# Patient Record
Sex: Female | Born: 1965 | Race: White | Hispanic: No | Marital: Single | Smoking: Current every day smoker
Health system: Southern US, Community
[De-identification: ages and names within clinical notes are randomized; demographics above are authoritative.]

## PROBLEM LIST (undated history)

## (undated) DIAGNOSIS — F329 Major depressive disorder, single episode, unspecified: Secondary | ICD-10-CM

## (undated) DIAGNOSIS — F419 Anxiety disorder, unspecified: Secondary | ICD-10-CM

## (undated) DIAGNOSIS — K219 Gastro-esophageal reflux disease without esophagitis: Secondary | ICD-10-CM

## (undated) DIAGNOSIS — I10 Essential (primary) hypertension: Secondary | ICD-10-CM

## (undated) DIAGNOSIS — F32A Depression, unspecified: Secondary | ICD-10-CM

## (undated) HISTORY — DX: Anxiety disorder, unspecified: F41.9

## (undated) HISTORY — DX: Depression, unspecified: F32.A

## (undated) HISTORY — DX: Major depressive disorder, single episode, unspecified: F32.9

## (undated) HISTORY — PX: ABDOMINAL SURGERY: SHX537

## (undated) HISTORY — PX: FOOT SURGERY: SHX648

---

## 2009-03-24 ENCOUNTER — Ambulatory Visit: Payer: Self-pay | Admitting: Internal Medicine

## 2011-05-01 ENCOUNTER — Inpatient Hospital Stay: Payer: Self-pay | Admitting: Surgery

## 2011-05-01 ENCOUNTER — Ambulatory Visit: Payer: Self-pay | Admitting: Family Medicine

## 2011-06-04 ENCOUNTER — Ambulatory Visit: Payer: Self-pay | Admitting: Surgery

## 2011-06-05 ENCOUNTER — Ambulatory Visit: Payer: Self-pay | Admitting: Surgery

## 2011-07-13 ENCOUNTER — Ambulatory Visit: Payer: Self-pay | Admitting: Surgery

## 2011-07-20 ENCOUNTER — Inpatient Hospital Stay: Payer: Self-pay | Admitting: Surgery

## 2013-10-16 ENCOUNTER — Ambulatory Visit: Payer: Self-pay | Admitting: Nurse Practitioner

## 2013-12-05 ENCOUNTER — Ambulatory Visit: Payer: Self-pay | Admitting: Emergency Medicine

## 2013-12-24 ENCOUNTER — Ambulatory Visit: Payer: Self-pay

## 2013-12-24 LAB — COMPREHENSIVE METABOLIC PANEL
ALK PHOS: 76 U/L
ALT: 35 U/L (ref 12–78)
Albumin: 3.8 g/dL (ref 3.4–5.0)
Anion Gap: 12 (ref 7–16)
BILIRUBIN TOTAL: 0.4 mg/dL (ref 0.2–1.0)
BUN: 10 mg/dL (ref 7–18)
CALCIUM: 8.3 mg/dL — AB (ref 8.5–10.1)
CHLORIDE: 104 mmol/L (ref 98–107)
Co2: 23 mmol/L (ref 21–32)
Creatinine: 0.82 mg/dL (ref 0.60–1.30)
EGFR (African American): >60
EGFR (Non-African Amer.): >60
Glucose: 108 mg/dL — ABNORMAL HIGH (ref 65–99)
Osmolality: 277 (ref 275–301)
POTASSIUM: 3.8 mmol/L (ref 3.5–5.1)
SGOT(AST): 14 U/L — ABNORMAL LOW (ref 15–37)
Sodium: 139 mmol/L (ref 136–145)
Total Protein: 7.4 g/dL (ref 6.4–8.2)

## 2013-12-24 LAB — CBC WITH DIFFERENTIAL/PLATELET
Basophil #: 0.1 10*3/uL (ref 0.0–0.1)
Basophil %: 0.6 %
Eosinophil #: 0.3 10*3/uL (ref 0.0–0.7)
Eosinophil %: 2.7 %
HCT: 37.6 % (ref 35.0–47.0)
HGB: 12.7 g/dL (ref 12.0–16.0)
Lymphocyte #: 2.7 10*3/uL (ref 1.0–3.6)
Lymphocyte %: 25.6 %
MCH: 31.1 pg (ref 26.0–34.0)
MCHC: 33.8 g/dL (ref 32.0–36.0)
MCV: 92 fL (ref 80–100)
MONOS PCT: 6.6 %
Monocyte #: 0.7 x10 3/mm (ref 0.2–0.9)
NEUTROS ABS: 6.9 10*3/uL — AB (ref 1.4–6.5)
Neutrophil %: 64.5 %
PLATELETS: 255 10*3/uL (ref 150–440)
RBC: 4.09 10*6/uL (ref 3.80–5.20)
RDW: 13 % (ref 11.5–14.5)
WBC: 10.7 10*3/uL (ref 3.6–11.0)

## 2013-12-24 LAB — URIC ACID: Uric Acid: 5 mg/dL (ref 2.6–6.0)

## 2014-10-13 IMAGING — CR DG TOE 3RD*R*
1 series · 3 of 3 positions shown · non-contrast
Comparison: None.

CLINICAL DATA: Swelling of the toe

EXAM:
RIGHT THIRD TOE

[Series 1: ap · 0.17mm/px · 3 of 3 slices shown]
[im 1/3]
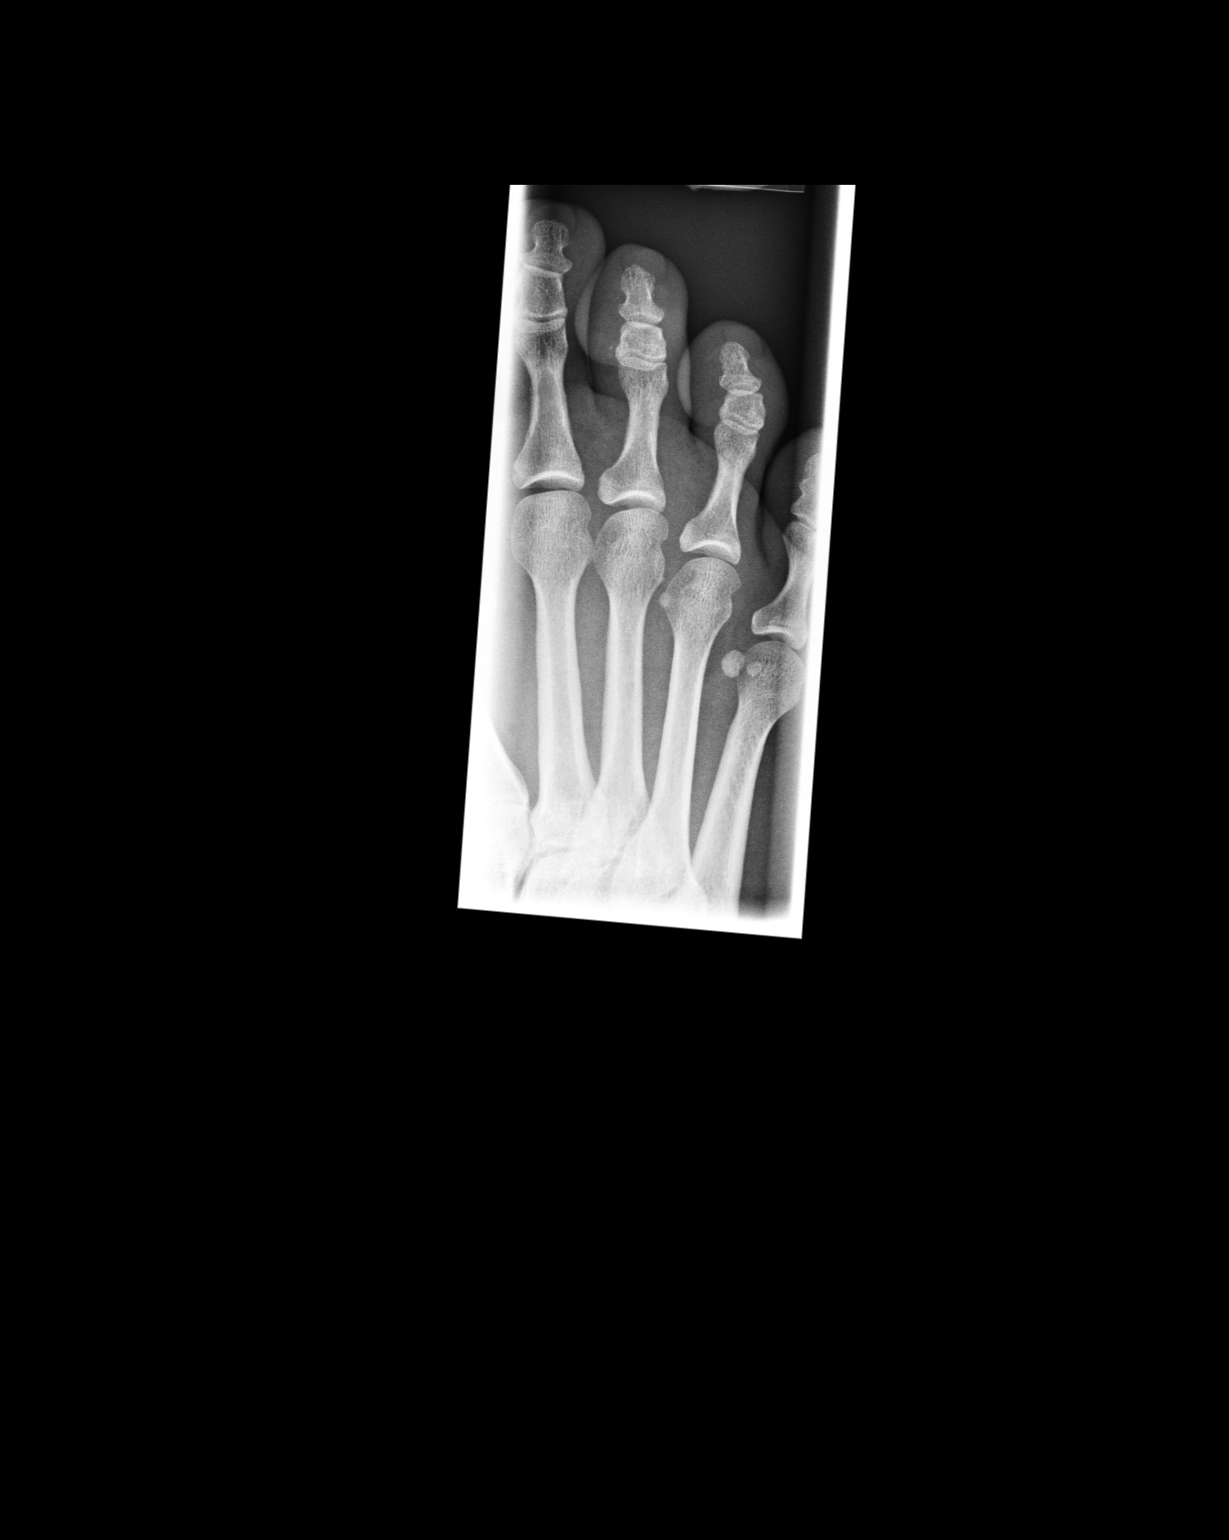
[im 2/3]
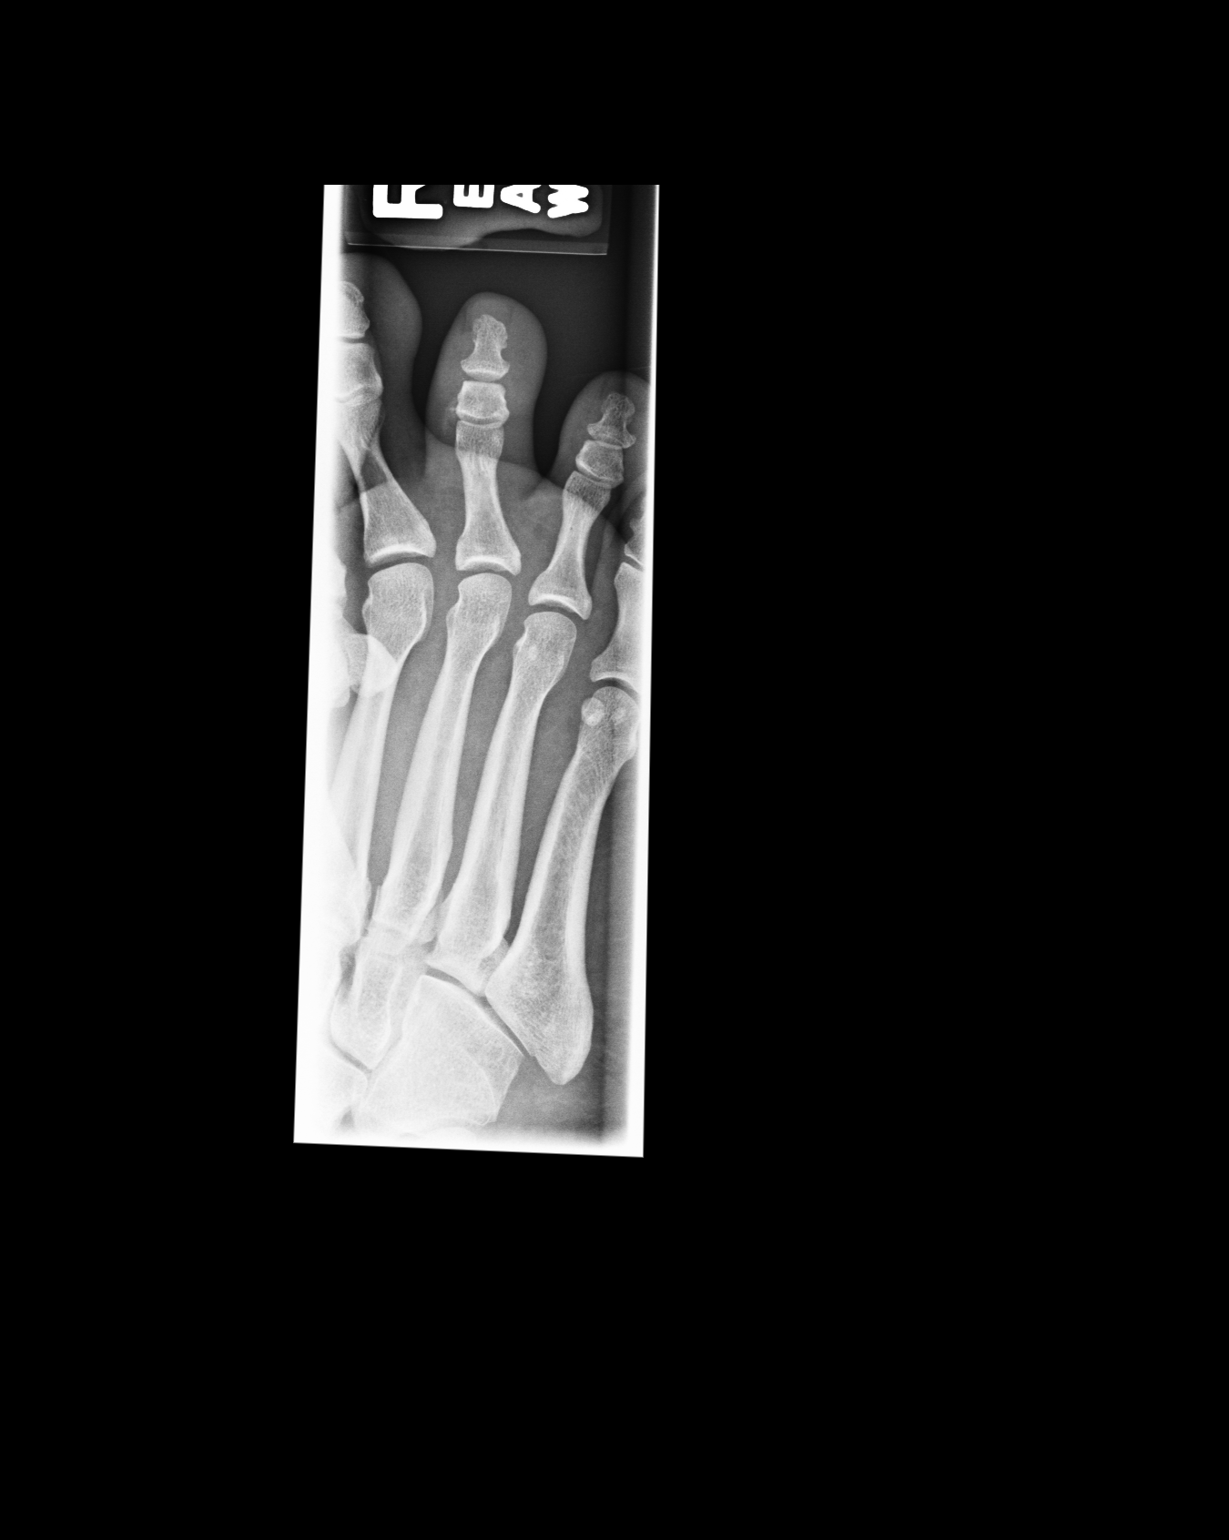
[im 3/3]
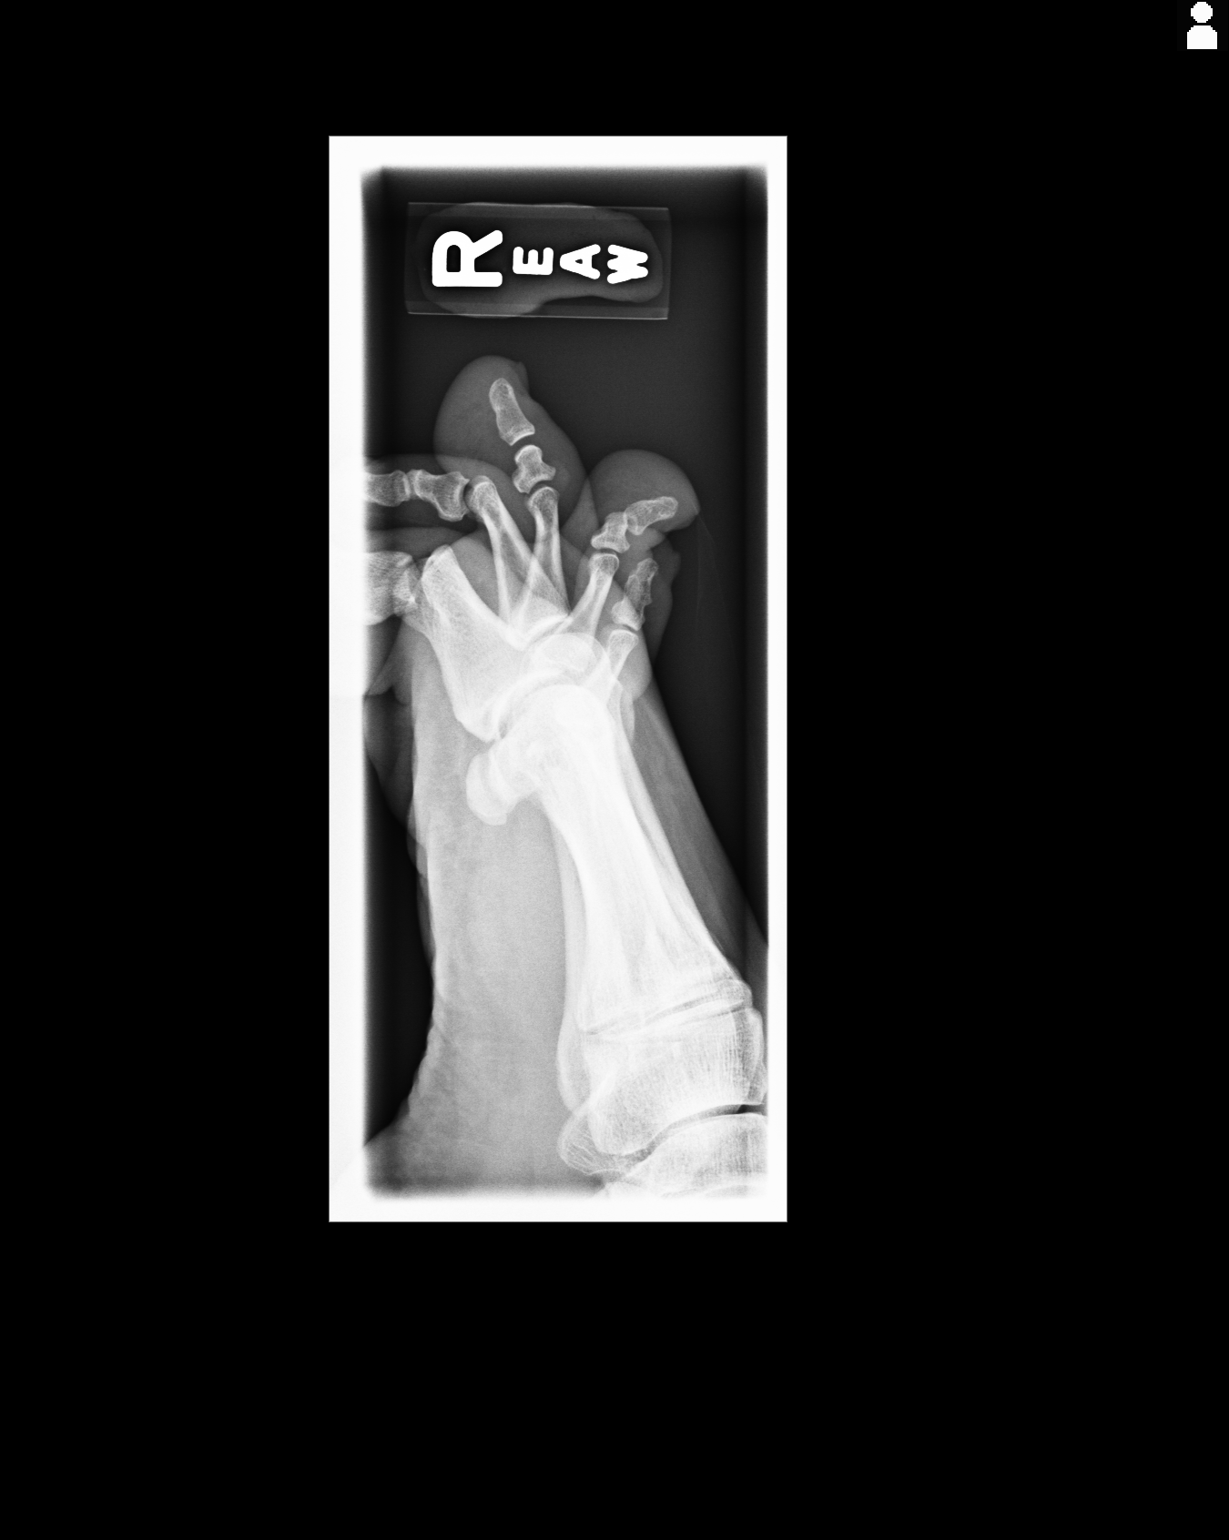

[3 of 3 positions shown; findings below may reference images not displayed]

FINDINGS: No acute fracture or dislocation is noted. On the medial aspect of
the third middle phalanx and slightly dorsally there is a a
radiopaque density which may represent a foreign body. Clinical
correlation with any recent injury is recommended. No other focal
abnormality is seen.

## 2014-12-13 ENCOUNTER — Ambulatory Visit
Admit: 2014-12-13 | Disposition: A | Discharge status: Home / Self Care | Payer: Self-pay | Attending: Family Medicine | Admitting: Family Medicine

## 2015-01-03 ENCOUNTER — Encounter: Payer: Self-pay | Admitting: Podiatry

## 2015-01-03 ENCOUNTER — Ambulatory Visit (INDEPENDENT_AMBULATORY_CARE_PROVIDER_SITE_OTHER): Payer: No Typology Code available for payment source | Admitting: Podiatry

## 2015-01-03 ENCOUNTER — Ambulatory Visit (INDEPENDENT_AMBULATORY_CARE_PROVIDER_SITE_OTHER): Payer: No Typology Code available for payment source

## 2015-01-03 VITALS — BP 153/98 | HR 68 | Resp 16 | Ht 67.5 in | Wt 180.0 lb

## 2015-01-03 DIAGNOSIS — M779 Enthesopathy, unspecified: Secondary | ICD-10-CM

## 2015-01-03 DIAGNOSIS — M7751 Other enthesopathy of right foot: Secondary | ICD-10-CM

## 2015-01-03 DIAGNOSIS — M2010 Hallux valgus (acquired), unspecified foot: Secondary | ICD-10-CM

## 2015-01-03 DIAGNOSIS — M205X2 Other deformities of toe(s) (acquired), left foot: Secondary | ICD-10-CM

## 2015-01-03 DIAGNOSIS — M109 Gout, unspecified: Secondary | ICD-10-CM | POA: Insufficient documentation

## 2015-01-03 DIAGNOSIS — IMO0002 Reserved for concepts with insufficient information to code with codable children: Secondary | ICD-10-CM | POA: Insufficient documentation

## 2015-01-03 DIAGNOSIS — K5792 Diverticulitis of intestine, part unspecified, without perforation or abscess without bleeding: Secondary | ICD-10-CM | POA: Insufficient documentation

## 2015-01-03 DIAGNOSIS — E559 Vitamin D deficiency, unspecified: Secondary | ICD-10-CM | POA: Insufficient documentation

## 2015-01-03 DIAGNOSIS — M778 Other enthesopathies, not elsewhere classified: Secondary | ICD-10-CM

## 2015-01-03 DIAGNOSIS — F419 Anxiety disorder, unspecified: Secondary | ICD-10-CM | POA: Insufficient documentation

## 2015-01-03 DIAGNOSIS — E785 Hyperlipidemia, unspecified: Secondary | ICD-10-CM | POA: Insufficient documentation

## 2015-01-03 DIAGNOSIS — F329 Major depressive disorder, single episode, unspecified: Secondary | ICD-10-CM | POA: Insufficient documentation

## 2015-01-03 DIAGNOSIS — F32A Depression, unspecified: Secondary | ICD-10-CM | POA: Insufficient documentation

## 2015-01-03 NOTE — Patient Instructions (Signed)
Pre-Operative Instructions  Congratulations, you have decided to take an important step to improving your quality of life.  You can be assured that the doctors of [facility] will be with you every step of the way.  1. Plan to be at the surgery center/hospital at least 1 (one) hour prior to your scheduled time unless otherwise directed by the surgical center/hospital staff.  You must have a responsible adult accompany you, remain during the surgery and drive you home.  Make sure you have directions to the surgical center/hospital and know how to get there on time. 2. For hospital based surgery you will need to obtain a history and physical form from your family physician within 1 month prior to the date of surgery- we will give you a form for you primary physician.  3. We make every effort to accommodate the date you request for surgery.  There are however, times where surgery dates or times have to be moved.  We will contact you as soon as possible if a change in schedule is required.   4. No Aspirin/Ibuprofen for one week before surgery.  If you are on aspirin, any non-steroidal anti-inflammatory medications (Mobic, Aleve, Ibuprofen) you should stop taking it 7 days prior to your surgery.  You make take Tylenol  For pain prior to surgery.  5. Medications- If you are taking daily heart and blood pressure medications, seizure, reflux, allergy, asthma, anxiety, pain or diabetes medications, make sure the surgery center/hospital is aware before the day of surgery so they may notify you which medications to take or avoid the day of surgery. 6. No food or drink after midnight the night before surgery unless directed otherwise by surgical center/hospital staff. 7. No alcoholic beverages 24 hours prior to surgery.  No smoking 24 hours prior to or 24 hours after surgery. 8. Wear loose pants or shorts- loose enough to fit over bandages, boots, and casts. 9. No slip on shoes, sneakers are best. 10. Bring  your boot with you to the surgery center/hospital.  Also bring crutches or a walker if your physician has prescribed it for you.  If you do not have this equipment, it will be provided for you after surgery. 11. If you have not been contracted by the surgery center/hospital by the day before your surgery, call to confirm the date and time of your surgery. 12. Leave-time from work may vary depending on the type of surgery you have.  Appropriate arrangements should be made prior to surgery with your employer. 13. Prescriptions will be provided immediately following surgery by your doctor.  Have these filled as soon as possible after surgery and take the medication as directed. 14. Remove nail polish on the operative foot. 15. Wash the night before surgery.  The night before surgery wash the foot and leg well with the antibacterial soap provided and water paying special attention to beneath the toenails and in between the toes.  Rinse thoroughly with water and dry well with a towel.  Perform this wash unless told not to do so by your physician.  Enclosed: 1 Ice pack (please put in freezer the night before surgery)   1 Hibiclens skin cleaner   Pre-op Instructions  If you have any questions regarding the instructions, do not hesitate to call our office.  [place]: [address] [place], [place] [address] [phone]  [place]: [address] [place], [place] [address] [phone]  [place]: [address]  ,  [address] [phone]  Dr. Richard   Tuchman DPM, Dr. Norman Regal DPM Dr. Richard Sikora DPM, Dr. M. Todd Hyatt DPM, Dr. Kathryn Egerton DPM 

## 2015-01-03 NOTE — Progress Notes (Signed)
   Subjective:    Patient ID: Theresa Sanders, female    DOB: 24-Mar-1966, 49 y.o.   MRN: [id number]  HPI bilateral big toes are painful, the left foot the outside of my foot hurts. My feet have been hurting for over a year.  Bunions bilateral     Review of Systems  All other systems reviewed and are negative.      Objective:   Physical Exam: I have reviewed her past medical history medications allergies surgery social history and review of systems. Pulses are strongly palpable bilateral. Neurologic sensorium is intact per Semmes-Weinstein monofilament. Deep tendon reflexes are intact bilateral muscle strength +5 over 5 dorsiflexion plantar flexors and inverters everters all intrinsic musculature is intact. Orthopedic evaluation demonstrates all joints distal to the ankle for range of motion with exception of the first metatarsophalangeal joints bilaterally. These joints are so tender that she can hardly allow me to touch them. Both of the first metatarsophalangeal joints are swollen and painful on range of motion. There is large bony prominences limiting the range of motion. She states that she can hardly walk to perform her daily activities much lesser job at this point. Radiographic evaluation does demonstrate severe bony overgrowth first metatarsophalangeal joints bilateral left greater than right dorsal spurring. Subchondral sclerosis subchondral eburnation and cystic deformity is noted. These are severe hallux rigidus joints. Cutaneous evaluation demonstrates supple well-hydrated cutis no erythema edema saline as drainage or odor with exception of of the overlying joints.        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  Plan: We discussed the etiology pathology conservative versus surgical therapies and detail today. I was unable to  distract the joints for an intra-articular injection. At this point her only opportunity for relief will be a surgical intervention consisting of either a fusion of the first metatarsophalangeal joint or a arthroplasty of the first metatarsophalangeal joint. At this point we have chosen an arthroplasty, Keller arthroplasty with a single silicone implant left first metatarsophalangeal joint. We will try to distract the right great toe in the operating room to buy her some time within cortisone injection. We went over the consent form today line by line number by number giving her adequate time to ask questions she saw fit regarding both of these procedures I answered them to the best of my ability in layman's terms. She understood this was amenable to it and signed all 3 pages of the consent form. She was dispensed a cam walker for her postop. And I will follow-up with her in the near future for surgical intervention.

## 2015-01-19 ENCOUNTER — Other Ambulatory Visit: Payer: Self-pay | Admitting: Podiatry

## 2015-01-19 MED ORDER — CEPHALEXIN 500 MG PO CAPS: 500.0000 mg | ORAL_CAPSULE | Freq: Three times a day (TID) | ORAL | Status: DC

## 2015-01-19 MED ORDER — PROMETHAZINE HCL 25 MG PO TABS: 25.0000 mg | ORAL_TABLET | Freq: Three times a day (TID) | ORAL | Status: DC | PRN

## 2015-01-19 MED ORDER — OXYCODONE-ACETAMINOPHEN 10-325 MG PO TABS: ORAL_TABLET | ORAL | Status: DC

## 2015-01-21 ENCOUNTER — Encounter: Payer: Self-pay | Admitting: Podiatry

## 2015-01-21 DIAGNOSIS — M2012 Hallux valgus (acquired), left foot: Secondary | ICD-10-CM | POA: Diagnosis not present

## 2015-01-26 ENCOUNTER — Encounter: Payer: Self-pay | Admitting: Podiatry

## 2015-01-26 ENCOUNTER — Ambulatory Visit (INDEPENDENT_AMBULATORY_CARE_PROVIDER_SITE_OTHER): Payer: No Typology Code available for payment source | Admitting: Podiatry

## 2015-01-26 ENCOUNTER — Ambulatory Visit (INDEPENDENT_AMBULATORY_CARE_PROVIDER_SITE_OTHER): Payer: No Typology Code available for payment source

## 2015-01-26 VITALS — BP 171/91 | HR 69 | Resp 16

## 2015-01-26 DIAGNOSIS — M205X2 Other deformities of toe(s) (acquired), left foot: Secondary | ICD-10-CM

## 2015-01-26 DIAGNOSIS — Z9889 Other specified postprocedural states: Secondary | ICD-10-CM

## 2015-01-26 MED ORDER — OXYCODONE-ACETAMINOPHEN 10-325 MG PO TABS: ORAL_TABLET | ORAL | Status: DC

## 2015-01-26 NOTE — Progress Notes (Signed)
She presents today 1 week status post Keller arthroplasty with single silicone implant left foot. Date of surgery Friday, 01/21/2015. She states that she's doing quite well. Continues to take pain medication as needed. Denies fever chills nausea vomiting muscle aches and pains other than soreness in the foot. She states she's been trying to move the toes a little bit.  Objective: Vital signs are stable alert and oriented 3. Pulses are strongly palpable. Dry sterile dressing was intact once removed demonstrates minimal edema no erythema cellulitis drainage or odor mild dehiscence of the distalmost aspect of the wound but no signs of infection. Radiographic evaluation demonstrates a well-placed Keller arthroplasty with a single silicone implant into grommets. No signs of bone infection. No fractures are identified.  Assessment: Well-healing surgical foot.  Plan: Discussed etiology pathology conservative versus surgical therapies. Encouraged range of motion exercises. Place Steri-Strips to the incision line today. Placed Silvadene cream Telfa pad dry sterile compressive dressing and placed her back in her cam walker. She understands she is to keep this elevated she can only take the boot off to perform her exercises. I will follow up with her in 1 week at which time we have to be able to remove the sutures. She will call with questions or concerns and I did refill her pain medication.

## 2015-01-27 NOTE — Progress Notes (Signed)
DOS 01/21/2015 Keller arthroplasty with single silicone implant left foot, injection right 1st MTPJ with steroid.

## 2015-02-02 ENCOUNTER — Encounter: Payer: Self-pay | Admitting: Podiatry

## 2015-02-02 ENCOUNTER — Ambulatory Visit (INDEPENDENT_AMBULATORY_CARE_PROVIDER_SITE_OTHER): Payer: No Typology Code available for payment source | Admitting: Podiatry

## 2015-02-02 VITALS — BP 156/85 | HR 69 | Temp 97.9°F | Resp 16

## 2015-02-02 DIAGNOSIS — Z9889 Other specified postprocedural states: Secondary | ICD-10-CM

## 2015-02-02 DIAGNOSIS — M205X2 Other deformities of toe(s) (acquired), left foot: Secondary | ICD-10-CM | POA: Diagnosis not present

## 2015-02-02 NOTE — Progress Notes (Signed)
She presents today 2 weeks status post Keller arthroplasty with single silicone implant left foot. She states it is doing so much better she is very happy with the outcome thus far.  Objective: Vital signs are stable she's alert and oriented 3 margins appear to be well coapted sutures are in place she has great range of motion dorsiflexion and plantarflexion.  Assessment: Well-healed surgical Keller arthroplasty with single silicone implant.  Plan: Remove stitches today Ace wrap compression anklet and will allow her to start walking with a Darco shoe. She will start washing this foot regularly and I will follow-up with her in 2 weeks.

## 2015-02-03 ENCOUNTER — Telehealth: Payer: Self-pay | Admitting: *Deleted

## 2015-02-03 MED ORDER — OXYCODONE-ACETAMINOPHEN 10-325 MG PO TABS: ORAL_TABLET | ORAL | Status: DC

## 2015-02-03 NOTE — Telephone Encounter (Signed)
Ok for refill? 

## 2015-02-03 NOTE — Telephone Encounter (Signed)
Pt request more pain medication, foot is painful due to suture removal, and would like enough for 1 week.

## 2015-02-04 NOTE — Telephone Encounter (Signed)
Patient was called and notified to pick up prescription in [place]

## 2015-02-14 ENCOUNTER — Telehealth: Payer: Self-pay | Admitting: *Deleted

## 2015-02-14 NOTE — Telephone Encounter (Signed)
Pt states she would like to get a refill of her pain medication, and she will pick it up at her Wednesday appt.

## 2015-02-16 ENCOUNTER — Ambulatory Visit (INDEPENDENT_AMBULATORY_CARE_PROVIDER_SITE_OTHER): Payer: No Typology Code available for payment source

## 2015-02-16 ENCOUNTER — Ambulatory Visit (INDEPENDENT_AMBULATORY_CARE_PROVIDER_SITE_OTHER): Payer: No Typology Code available for payment source | Admitting: Podiatry

## 2015-02-16 VITALS — BP 167/96 | HR 88 | Resp 16

## 2015-02-16 DIAGNOSIS — M205X2 Other deformities of toe(s) (acquired), left foot: Secondary | ICD-10-CM

## 2015-02-16 DIAGNOSIS — Z9889 Other specified postprocedural states: Secondary | ICD-10-CM

## 2015-02-16 MED ORDER — OXYCODONE-ACETAMINOPHEN 10-325 MG PO TABS: ORAL_TABLET | ORAL | Status: DC

## 2015-02-16 NOTE — Telephone Encounter (Signed)
Patient presents for appt today and pain meds were refilled and hard copy given to pt.

## 2015-02-17 NOTE — Progress Notes (Signed)
She presents today a little more than 1 month status post Keller arthroplasty single silicone implant left foot. He states it is been real sore.  Objective: Vital signs are stable she is alert and oriented 3. No erythematous mild edema no cellulitis drainage or odor. She has good range of motion of the joint.  Assessment: Well-healing surgical foot.  Plan: I encouraged her to try to get back into a regular pair shoes continue anti-inflammatory's as needed and follow up with me in 1 month.

## 2015-02-28 ENCOUNTER — Other Ambulatory Visit: Payer: Self-pay | Admitting: *Deleted

## 2015-02-28 MED ORDER — OXYCODONE-ACETAMINOPHEN 10-325 MG PO TABS: ORAL_TABLET | ORAL | Status: DC

## 2015-02-28 NOTE — Telephone Encounter (Signed)
Ok to fill per dr Milinda Pointer

## 2015-02-28 NOTE — Telephone Encounter (Signed)
Would like a refill on my pain medication , would like to pick it up before lunch today

## 2015-03-08 ENCOUNTER — Other Ambulatory Visit: Payer: Self-pay | Admitting: *Deleted

## 2015-03-08 NOTE — Telephone Encounter (Signed)
Pt states she is going on vacation next week and would like a refill of her pain medication.

## 2015-03-09 MED ORDER — OXYCODONE-ACETAMINOPHEN 10-325 MG PO TABS: ORAL_TABLET | ORAL | Status: DC

## 2015-03-09 NOTE — Telephone Encounter (Signed)
OK TO FILL PER DR HYATT , THIS IS THE LAST REFILL

## 2015-03-23 ENCOUNTER — Encounter: Payer: No Typology Code available for payment source | Admitting: Podiatry

## 2015-03-28 ENCOUNTER — Ambulatory Visit (INDEPENDENT_AMBULATORY_CARE_PROVIDER_SITE_OTHER): Payer: No Typology Code available for payment source | Admitting: Podiatry

## 2015-03-28 ENCOUNTER — Ambulatory Visit (INDEPENDENT_AMBULATORY_CARE_PROVIDER_SITE_OTHER): Payer: No Typology Code available for payment source

## 2015-03-28 VITALS — BP 160/95 | HR 79 | Resp 16

## 2015-03-28 DIAGNOSIS — Z9889 Other specified postprocedural states: Secondary | ICD-10-CM

## 2015-03-28 DIAGNOSIS — M205X2 Other deformities of toe(s) (acquired), left foot: Secondary | ICD-10-CM

## 2015-03-28 NOTE — Progress Notes (Signed)
She presents today for follow-up of her Jake Michaelis arthroplasty first metatarsophalangeal joint left foot with single silicone implant. Date of surgery was 01/21/2015. She states that is doing real good.  Objective: Vital signs are stable she is alert and oriented 3. She has no pain on range of motion of the first metatarsophalangeal joint left foot. Pulses remain palpable. She has great range of motion dorsiflexion and plantarflexion left foot. Radiographs confirm a Keller arthroplasty with single silicone implant with 2 grommets.  Assessment: Well-healing surgical foot date of surgery 01/21/2015.  Plan: Follow up with me on an as-needed basis.

## 2015-04-25 ENCOUNTER — Encounter: Payer: No Typology Code available for payment source | Admitting: Podiatry

## 2015-10-12 ENCOUNTER — Ambulatory Visit
Admission: EM | Admit: 2015-10-12 | Discharge: 2015-10-12 | Disposition: A | Discharge status: Home / Self Care | Payer: PRIVATE HEALTH INSURANCE | Attending: Family Medicine | Admitting: Family Medicine

## 2015-10-12 ENCOUNTER — Encounter: Payer: Self-pay | Admitting: Emergency Medicine

## 2015-10-12 DIAGNOSIS — M62830 Muscle spasm of back: Secondary | ICD-10-CM | POA: Diagnosis not present

## 2015-10-12 DIAGNOSIS — M545 Low back pain, unspecified: Secondary | ICD-10-CM

## 2015-10-12 HISTORY — DX: Gastro-esophageal reflux disease without esophagitis: K21.9

## 2015-10-12 MED ORDER — KETOROLAC TROMETHAMINE 60 MG/2ML IM SOLN
60.0000 mg | Freq: Once | INTRAMUSCULAR | Status: AC
  Administered 2015-10-12: 60 mg via INTRAMUSCULAR

## 2015-10-12 MED ORDER — MELOXICAM 15 MG PO TABS: 15.0000 mg | ORAL_TABLET | Freq: Every day | ORAL | Status: DC

## 2015-10-12 MED ORDER — HYDROCODONE-ACETAMINOPHEN 5-325 MG PO TABS: 1.0000 | ORAL_TABLET | Freq: Three times a day (TID) | ORAL | Status: DC | PRN

## 2015-10-12 MED ORDER — ORPHENADRINE CITRATE ER 100 MG PO TB12: 100.0000 mg | ORAL_TABLET | Freq: Two times a day (BID) | ORAL | Status: DC

## 2015-10-12 NOTE — ED Provider Notes (Addendum)
CSN: [id number]     Arrival date & time 10/12/15  0803 History   First MD Initiated Contact with Patient 10/12/15 [phone]   Nurses notes were reviewed. Chief Complaint  Patient presents with  . Back Pain   Patient is here because of back pain. She was helping someone paint Saturday when she twisted and felt sharp pain in the left side of her back. Since the pain is getting worse. She has some back trouble before but nothing this nothing at this level pain either.  Unfortunately she stop smoking she has history of depression and anxiety and she uses Xanax on a when necessary basis. No known drug allergies. No pertinent family medical history   (Consider location/radiation/quality/duration/timing/severity/associated sxs/prior Treatment) Patient is a 50 y.o. female presenting with back pain. The history is provided by the patient. No language interpreter was used.  Back Pain Location:  Lumbar spine Quality:  Stabbing Radiates to:  Does not radiate Pain severity:  Moderate Onset quality:  Sudden Progression:  Worsening Chronicity:  New Context: physical stress and twisting   Relieved by:  Nothing Worsened by:  NSAIDs Ineffective treatments:  Ibuprofen Associated symptoms: no pelvic pain, no perianal numbness and no tingling     Past Medical History  Diagnosis Date  . Depression   . Anxiety   . GERD (gastroesophageal reflux disease)    Past Surgical History  Procedure Laterality Date  . Abdominal surgery     History reviewed. No pertinent family history. Social History  Substance Use Topics  . Smoking status: Current Some Day Smoker -- 1.00 packs/day    Types: Cigarettes  . Smokeless tobacco: Never Used  . Alcohol Use: No   OB History    No data available     Review of Systems  Genitourinary: Negative for pelvic pain.  Musculoskeletal: Positive for back pain.  Neurological: Negative for tingling.  All other systems reviewed and are negative.   Allergies  Review of  patient's allergies indicates no known allergies.  Home Medications   Prior to Admission medications   Medication Sig Start Date End Date Taking? Authorizing Provider  ALPRAZolam Duanne Moron) 0.5 MG tablet Take 0.5 mg by mouth at bedtime as needed for anxiety.   Yes Historical Provider, MD  omeprazole (PRILOSEC) 20 MG capsule Take 20 mg by mouth daily.   Yes Historical Provider, MD  cephALEXin (KEFLEX) 500 MG capsule Take 1 capsule (500 mg total) by mouth 3 (three) times daily. 01/19/15   Max T Hyatt, DPM  HYDROcodone-acetaminophen (NORCO) 5-325 MG tablet Take 1 tablet by mouth every 8 (eight) hours as needed for moderate pain. 10/12/15   Frederich Cha, MD  meloxicam (MOBIC) 15 MG tablet Take 1 tablet (15 mg total) by mouth daily. 10/12/15   Frederich Cha, MD  orphenadrine (NORFLEX) 100 MG tablet Take 1 tablet (100 mg total) by mouth 2 (two) times daily. 10/12/15   Frederich Cha, MD  oxyCODONE-acetaminophen (PERCOCET) 10-325 MG per tablet Take one to two tablets by mouth every six to eight hours as needed for pain. 02/16/15   Max T Hyatt, DPM  oxyCODONE-acetaminophen (PERCOCET) 10-325 MG per tablet Take one to two tablets by mouth every six to eight hours as needed for pain. 03/09/15   Max T Hyatt, DPM  PARoxetine (PAXIL) 20 MG tablet TAKE ONE TABLET BY MOUTH ONCE DAILY 12/31/14   Historical Provider, MD  promethazine (PHENERGAN) 25 MG tablet Take 1 tablet (25 mg total) by mouth every 8 (eight) hours as needed  for nausea or vomiting. 01/19/15   Max Villa Herb, DPM   Meds Ordered and Administered this Visit   Medications  ketorolac (TORADOL) injection 60 mg (not administered)    BP 153/78 mmHg  Pulse 72  Temp(Src) 98.1 F (36.7 C) (Oral)  Resp 16  Ht 5\' 7"  (1.702 m)  Wt 180 lb (81.647 kg)  BMI 28.19 kg/m2  SpO2 99%  LMP 09/28/2015 (Approximate) No data found.   Physical Exam  Constitutional: She is oriented to person, place, and time. She appears well-developed and well-nourished.  HENT:  Head:  Normocephalic and atraumatic.  Eyes: Conjunctivae are normal. Pupils are equal, round, and reactive to light.  Neck: Normal range of motion. Neck supple.  Musculoskeletal: She exhibits tenderness. She exhibits no edema.       Lumbar back: She exhibits tenderness and spasm.       Back:  Patient's pain essentially over on the left lateral side of spinal cord. Marked muscle spasms present consistent and reproducible pain that she's had.  Neurological: She is alert and oriented to person, place, and time. She has normal strength. No cranial nerve deficit or sensory deficit.  Skin: Skin is warm. No rash noted.  Psychiatric: She has a normal mood and affect.  Vitals reviewed.   ED Course  Procedures (including critical care time)  Labs Review Labs Reviewed - No data to display  Imaging Review No results found.   Visual Acuity Review  Right Eye Distance:   Left Eye Distance:   Bilateral Distance:    Right Eye Near:   Left Eye Near:    Bilateral Near:         MDM   1. Muscle spasm of back   2. Left-sided low back pain without sciatica    We'll place a Norflex 1 tablet twice a day Mobic 1 tablet daily and use Vicodin as needed night for sleep. Initially was on use tramadol but since she is rated taking Percocet for the past will go Vicodin instead. No for today and all. Recommend that she's not better by next week see her PCP or consider seeing a chiropractor  Patient was given an injection Toradol to help with the pain which did.  Frederich Cha, MD 10/12/15 [id number]  Frederich Cha, MD 10/12/15 [place], MD 10/17/15 [phone]

## 2015-10-12 NOTE — ED Notes (Signed)
Patient c/o back pain mostly on her left side.  Patient states that she was moving furniture yesterday.

## 2015-10-12 NOTE — Discharge Instructions (Signed)
Back Pain, Adult Back pain is very common. The pain often gets better over time. The cause of back pain is usually not dangerous. Most people can learn to manage their back pain on their own.  HOME CARE  Watch your back pain for any changes. The following actions may help to lessen any pain you are feeling:  Stay active. Start with short walks on flat ground if you can. Try to walk farther each day.  Exercise regularly as told by your doctor. Exercise helps your back heal faster. It also helps avoid future injury by keeping your muscles strong and flexible.  Do not sit, drive, or stand in one place for more than 30 minutes.  Do not stay in bed. Resting more than 1-2 days can slow down your recovery.  Be careful when you bend or lift an object. Use good form when lifting:  Bend at your knees.  Keep the object close to your body.  Do not twist.  Sleep on a firm mattress. Lie on your side, and bend your knees. If you lie on your back, put a pillow under your knees.  Take medicines only as told by your doctor.  Put ice on the injured area.  Put ice in a plastic bag.  Place a towel between your skin and the bag.  Leave the ice on for 20 minutes, 2-3 times a day for the first 2-3 days. After that, you can switch between ice and heat packs.  Avoid feeling anxious or stressed. Find good ways to deal with stress, such as exercise.  Maintain a healthy weight. Extra weight puts stress on your back. GET HELP IF:   You have pain that does not go away with rest or medicine.  You have worsening pain that goes down into your legs or buttocks.  You have pain that does not get better in one week.  You have pain at night.  You lose weight.  You have a fever or chills. GET HELP RIGHT AWAY IF:   You cannot control when you poop (bowel movement) or pee (urinate).  Your arms or legs feel weak.  Your arms or legs lose feeling (numbness).  You feel sick to your stomach (nauseous) or  throw up (vomit).  You have belly (abdominal) pain.  You feel like you may pass out (faint).   This information is not intended to replace advice given to you by your health care provider. Make sure you discuss any questions you have with your health care provider.   Document Released: 02/06/2008 Document Revised: 09/10/2014 Document Reviewed: 12/22/2013 Elsevier Interactive Patient Education 2016 Else[employer]  Muscle Cramps and Spasms Muscle cramps and spasms are when muscles tighten by themselves. They usually get better within minutes. Muscle cramps are painful. They are usually stronger and last longer than muscle spasms. Muscle spasms may or may not be painful. They can last a few seconds or much longer. HOME CARE  Drink enough fluid to keep your pee (urine) clear or pale yellow.  Massage, stretch, and relax the muscle.  Use a warm towel, heating pad, or warm shower water on tight muscles.  Place ice on the muscle if it is tender or in pain.  Put ice in a plastic bag.  Place a towel between your skin and the bag.  Leave the ice on for 15-20 minutes, 03-04 times a day.  Only take medicine as told by your doctor. GET HELP RIGHT AWAY IF:  Your cramps or spasms  get worse, happen more often, or do not get better with time. MAKE SURE YOU:  Understand these instructions.  Will watch your condition.  Will get help right away if you are not doing well or get worse.   This information is not intended to replace advice given to you by your health care provider. Make sure you discuss any questions you have with your health care provider.   Document Released: 08/02/2008 Document Revised: 12/15/2012 Document Reviewed: 08/06/2012 Elsevier Interactive Patient Education 2016 [employer] Injury Prevention Back injuries can be very painful. They can also be difficult to heal. After having one back injury, you are more likely to injure your back again. It is important to  learn how to avoid injuring or re-injuring your back. The following tips can help you to prevent a back injury. WHAT SHOULD I KNOW ABOUT PHYSICAL FITNESS?  Exercise for 30 minutes per day on most days of the week or as told by your doctor. Make sure to:  Do aerobic exercises, such as walking, jogging, biking, or swimming.  Do exercises that increase balance and strength, such as tai chi and yoga.  Do stretching exercises. This helps with flexibility.  Try to develop strong belly (abdominal) muscles. Your belly muscles help to support your back.  Stay at a healthy weight. This helps to decrease your risk of a back injury. WHAT SHOULD I KNOW ABOUT MY DIET?  Talk with your doctor about your overall diet. Take supplements and vitamins only as told by your doctor.  Talk with your doctor about how much calcium and vitamin D you need each day. These nutrients help to prevent weakening of the bones (osteoporosis).  Include good sources of calcium in your diet, such as:  Dairy products.  Green leafy vegetables.  Products that have had calcium added to them (fortified).  Include good sources of vitamin D in your diet, such as:  Milk.  Foods that have had vitamin D added to them. WHAT SHOULD I KNOW ABOUT MY POSTURE?  Sit up straight and stand up straight. Avoid leaning forward when you sit or hunching over when you stand.  Choose chairs that have good low-back (lumbar) support.  If you work at a desk, sit close to it so you do not need to lean over. Keep your chin tucked in. Keep your neck drawn back. Keep your elbows bent so your arms look like the letter "L" (right angle).  Sit high and close to the steering wheel when you drive. Add a low-back support to your car seat, if needed.  Avoid sitting or standing in one position for very long. Take breaks to get up, stretch, and walk around at least one time every hour. Take breaks every hour if you are driving for long periods of  time.  Sleep on your side with your knees slightly bent, or sleep on your back with a pillow under your knees. Do not lie on the front of your body to sleep. WHAT SHOULD I KNOW ABOUT LIFTING, TWISTING, AND REACHING Lifting and Heavy Lifting  Avoid heavy lifting, especially lifting over and over again. If you must do heavy lifting:  Stretch before lifting.  Work slowly.  Rest between lifts.  Use a tool such as a cart or a dolly to move objects if one is available.  Make several small trips instead of carrying one heavy load.  Ask for help when you need it, especially when moving big objects.  Follow these  steps when lifting:  Stand with your feet shoulder-width apart.  Get as close to the object as you can. Do not pick up a heavy object that is far from your body.  Use handles or lifting straps if they are available.  Bend at your knees. Squat down, but keep your heels off the floor.  Keep your shoulders back. Keep your chin tucked in. Keep your back straight.  Lift the object slowly while you tighten the muscles in your legs, belly, and butt. Keep the object as close to the center of your body as possible.  Follow these steps when putting down a heavy load:  Stand with your feet shoulder-width apart.  Lower the object slowly while you tighten the muscles in your legs, belly, and butt. Keep the object as close to the center of your body as possible.  Keep your shoulders back. Keep your chin tucked in. Keep your back straight.  Bend at your knees. Squat down, but keep your heels off the floor.  Use handles or lifting straps if they are available. Twisting and Reaching  Avoid lifting heavy objects above your waist.  Do not twist at your waist while you are lifting or carrying a load. If you need to turn, move your feet.  Do not bend over without bending at your knees.  Avoid reaching over your head, across a table, or for an object on a high surface.  WHAT ARE  SOME OTHER TIPS?  Avoid wet floors and icy ground. Keep sidewalks clear of ice to prevent falls.   Do not sleep on a mattress that is too soft or too hard.   Keep items that you use often within easy reach.   Put heavier objects on shelves at waist level, and put lighter objects on lower or higher shelves.  Find ways to lower your stress, such as:  Exercise.  Massage.  Relaxation techniques.  Talk with your doctor if you feel anxious or depressed. These conditions can make back pain worse.  Wear flat heel shoes with cushioned soles.  Avoid making quick (sudden) movements.  Use both shoulder straps when carrying a backpack.  Do not use any tobacco products, including cigarettes, chewing tobacco, or electronic cigarettes. If you need help quitting, ask your doctor.   This information is not intended to replace advice given to you by your health care provider. Make sure you discuss any questions you have with your health care provider.   Document Released: 02/06/2008 Document Revised: 01/04/2015 Document Reviewed: 08/24/2014 Elsevier Interactive Patient Education [employer].

## 2016-02-20 ENCOUNTER — Ambulatory Visit (INDEPENDENT_AMBULATORY_CARE_PROVIDER_SITE_OTHER): Payer: Self-pay

## 2016-02-20 ENCOUNTER — Ambulatory Visit (INDEPENDENT_AMBULATORY_CARE_PROVIDER_SITE_OTHER): Payer: Managed Care, Other (non HMO) | Admitting: Podiatry

## 2016-02-20 DIAGNOSIS — M79673 Pain in unspecified foot: Secondary | ICD-10-CM

## 2016-02-20 DIAGNOSIS — M205X1 Other deformities of toe(s) (acquired), right foot: Secondary | ICD-10-CM

## 2016-02-20 DIAGNOSIS — M779 Enthesopathy, unspecified: Secondary | ICD-10-CM

## 2016-02-20 MED ORDER — METHYLPREDNISOLONE 4 MG PO TBPK: ORAL_TABLET | ORAL | Status: DC

## 2016-02-20 NOTE — Progress Notes (Signed)
She presents today with chief complaint of a painful first metatarsophalangeal joint right foot and a painful second metatarsophalangeal joint left foot.  Objective: Vital signs are stable alert and oriented 3. Pulses are palpable. Neurologic sensory is intact. She is pain on end range of motion of the first metatarsophalangeal joint and right foot as she once did with her first metatarsophalangeal joint of her left foot. She also has pain in range of motion of the second metatarsophalangeal joint of the left foot. Radiographs today confirm well-healing surgical foot left. Capsulitis second metatarsophalangeal joint left. Joint space narrowing subchondral sclerosis and degenerative joint disease of the first metatarsophalangeal joint of the right foot.  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 Capsulitis second metatarsophalangeal joint left foot.  Plan: Injected both these joints today with dexamethasone and local anesthetic. A follow-up within 1 month. Also wrote a prescription for meloxicam.

## 2016-02-24 ENCOUNTER — Encounter: Payer: Self-pay | Admitting: *Deleted

## 2016-02-27 ENCOUNTER — Telehealth: Payer: Self-pay | Admitting: *Deleted

## 2016-02-27 MED ORDER — TRAMADOL HCL 50 MG PO TABS: 50.0000 mg | ORAL_TABLET | Freq: Three times a day (TID) | ORAL | Status: DC | PRN

## 2016-02-27 NOTE — Telephone Encounter (Addendum)
Pt states her foot still hurts and her next appt is 03/19/2016 in the [place] office, she would like pain medication. Informed pt that Dr. Milinda Pointer ordered Tramadol and it had been called to the [facility] in [place]. Orders to [facility] [phone].

## 2016-02-27 NOTE — Telephone Encounter (Signed)
Tramadol 50mg  #30 one po q6-8

## 2016-03-19 ENCOUNTER — Ambulatory Visit: Payer: Managed Care, Other (non HMO) | Admitting: Podiatry

## 2016-04-06 ENCOUNTER — Encounter: Payer: Self-pay | Admitting: *Deleted

## 2016-05-16 ENCOUNTER — Ambulatory Visit
Admission: EM | Admit: 2016-05-16 | Discharge: 2016-05-16 | Disposition: A | Discharge status: Home / Self Care | Payer: Managed Care, Other (non HMO) | Attending: Family Medicine | Admitting: Family Medicine

## 2016-05-16 DIAGNOSIS — A084 Viral intestinal infection, unspecified: Secondary | ICD-10-CM | POA: Diagnosis not present

## 2016-05-16 DIAGNOSIS — R110 Nausea: Secondary | ICD-10-CM | POA: Diagnosis not present

## 2016-05-16 DIAGNOSIS — E860 Dehydration: Secondary | ICD-10-CM

## 2016-05-16 LAB — URINALYSIS COMPLETE WITH MICROSCOPIC (ARMC ONLY)
BILIRUBIN URINE: NEGATIVE
Bacteria, UA: NONE SEEN
GLUCOSE, UA: NEGATIVE mg/dL
KETONES UR: NEGATIVE mg/dL
Nitrite: NEGATIVE
Protein, ur: NEGATIVE mg/dL
SPECIFIC GRAVITY, URINE: 1.015 (ref 1.005–1.030)
pH: 7.5 (ref 5.0–8.0)

## 2016-05-16 LAB — PREGNANCY, URINE: Preg Test, Ur: NEGATIVE

## 2016-05-16 MED ORDER — ONDANSETRON 4 MG PO TBDP: 4.0000 mg | ORAL_TABLET | Freq: Three times a day (TID) | ORAL | 0 refills | Status: DC | PRN

## 2016-05-16 MED ORDER — ONDANSETRON 4 MG PO TBDP
4.0000 mg | ORAL_TABLET | Freq: Once | ORAL | Status: AC
  Administered 2016-05-16: 4 mg via ORAL

## 2016-05-16 NOTE — ED Provider Notes (Signed)
[facility]    CSN: [id number] Arrival date & time: 05/16/16  [id number]  First Provider Contact:  None       History   Chief Complaint Chief Complaint  Patient presents with  . Nausea    HPI Theresa Sanders is a 50 y.o. female.   HPI: Patient presents today with symptoms of nausea since Saturday. Patient has had episode or two of vomiting. She states that the vomiting now has resolved and does have watery diarrhea now. She denies any blood in her stool. She does have a history of diverticulitis however is not have any abdominal pain. She denies any fever or chills. She denies any upper respiratory symptoms. She has had some mild headache and dizziness. Today she has not eaten anything yet. She has been able to tolerate things like soup. She denies any sick contacts or recent travel.  Past Medical History:  Diagnosis Date  . Anxiety   . Depression   . GERD (gastroesophageal reflux disease)     Patient Active Problem List   Diagnosis Date Noted  . Anxiety 01/03/2015  . Clinical depression 01/03/2015  . Diverticulitis 01/03/2015  . Gout 01/03/2015  . HPV test positive 01/03/2015  . HLD (hyperlipidemia) 01/03/2015  . Avitaminosis D 01/03/2015    Past Surgical History:  Procedure Laterality Date  . ABDOMINAL SURGERY      OB History    No data available       Home Medications    Prior to Admission medications   Medication Sig Start Date End Date Taking? Authorizing Provider  ALPRAZolam Duanne Moron) 0.5 MG tablet Take 0.5 mg by mouth at bedtime as needed for anxiety.   Yes Historical Provider, MD  omeprazole (PRILOSEC) 20 MG capsule Take 20 mg by mouth daily.   Yes Historical Provider, MD  PARoxetine (PAXIL) 20 MG tablet TAKE ONE TABLET BY MOUTH ONCE DAILY 12/31/14  Yes Historical Provider, MD  cephALEXin (KEFLEX) 500 MG capsule Take 1 capsule (500 mg total) by mouth 3 (three) times daily. 01/19/15   Max T Hyatt, DPM  HYDROcodone-acetaminophen (NORCO) 5-325 MG  tablet Take 1 tablet by mouth every 8 (eight) hours as needed for moderate pain. 10/12/15   Frederich Cha, MD  meloxicam (MOBIC) 15 MG tablet Take 1 tablet (15 mg total) by mouth daily. 10/12/15   Frederich Cha, MD  methylPREDNISolone (MEDROL) 4 MG TBPK tablet Tapering 6 day dose pack 02/20/16   Max T Hyatt, DPM  orphenadrine (NORFLEX) 100 MG tablet Take 1 tablet (100 mg total) by mouth 2 (two) times daily. 10/12/15   Frederich Cha, MD  oxyCODONE-acetaminophen (PERCOCET) 10-325 MG per tablet Take one to two tablets by mouth every six to eight hours as needed for pain. 02/16/15   Max T Hyatt, DPM  oxyCODONE-acetaminophen (PERCOCET) 10-325 MG per tablet Take one to two tablets by mouth every six to eight hours as needed for pain. 03/09/15   Max T Hyatt, DPM  promethazine (PHENERGAN) 25 MG tablet Take 1 tablet (25 mg total) by mouth every 8 (eight) hours as needed for nausea or vomiting. 01/19/15   Max T Hyatt, DPM  traMADol (ULTRAM) 50 MG tablet Take 1 tablet (50 mg total) by mouth every 8 (eight) hours as needed. 02/27/16   Max Villa Herb, DPM    Family History History reviewed. No pertinent family history.  Social History Social History  Substance Use Topics  . Smoking status: Current Every Day Smoker    Packs/day: 0.50  Types: Cigarettes  . Smokeless tobacco: Never Used  . Alcohol use 0.0 oz/week     Comment: occasionally     Allergies   Review of patient's allergies indicates no known allergies.   Review of Systems Review of Systems: Negative except mentioned above.    Physical Exam Triage Vital Signs ED Triage Vitals  Enc Vitals Group     BP 05/16/16 0819 (!) 151/91     Pulse Rate 05/16/16 0819 73     Resp 05/16/16 0819 17     Temp 05/16/16 0819 98.1 F (36.7 C)     Temp Source 05/16/16 0819 Tympanic     SpO2 05/16/16 0819 99 %     Weight 05/16/16 0817 185 lb (83.9 kg)     Height 05/16/16 0817 5\' 8"  (1.727 m)     Head Circumference --      Peak Flow --      Pain Score 05/16/16 0821 7       Pain Loc --      Pain Edu? --      Excl. in [place]? --    No data found.   Updated Vital Signs BP (!) 151/91 (BP Location: Right Arm)   Pulse 73   Temp 98.1 F (36.7 C) (Tympanic)   Resp 17   Ht 5\' 8"  (1.727 m)   Wt 185 lb (83.9 kg)   LMP 05/09/2016   SpO2 99%   BMI 28.13 kg/m      Physical Exam:  GENERAL: NAD HEENT: no pharyngeal erythema, no exudate, no erythema of TMs, no cervical LAD RESP: CTA B CARD: RRR ABD: +BS, NT, no rebound or guarding, no flank tenderness NEURO: CN II-XII grossly intact    UC Treatments / Results  Labs (all labs ordered are listed, but only abnormal results are displayed) Labs Reviewed - No data to display  EKG  EKG Interpretation None       Radiology No results found.  Procedures Procedures (including critical care time)  Medications Ordered in UC Medications - No data to display   Initial Impression / Assessment and Plan / UC Course  I have reviewed the triage vital signs and the nursing notes.  Pertinent labs & imaging results that were available during my care of the patient were reviewed by me and considered in my medical decision making (see chart for details).  Clinical Course   A/P:Gastroenteritis- rest, hydration, Zofran prn, BRAT Diet, advance slowly, seek medical attention if symptoms persist or worsen as discussed.  Final Clinical Impressions(s) / UC Diagnoses   Final diagnoses:  None    New Prescriptions New Prescriptions   No medications on file     Paulina Fusi, MD 05/16/16 [phone]

## 2016-05-16 NOTE — Discharge Instructions (Signed)
Seek medical attention if symptoms worsen as discussed.

## 2016-05-16 NOTE — ED Triage Notes (Signed)
Patient complains of nausea that started on Saturday. Patient states that she has had some diarrhea, headaches and dizziness.

## 2016-05-17 LAB — URINE CULTURE

## 2016-05-22 ENCOUNTER — Telehealth: Payer: Self-pay | Admitting: *Deleted

## 2016-05-22 NOTE — Telephone Encounter (Signed)
Called patient and informed her that her urine culture resulted negative for UTI. Patient reports that she is feeling better, and appreciates the call.

## 2016-07-13 ENCOUNTER — Ambulatory Visit
Admission: EM | Admit: 2016-07-13 | Discharge: 2016-07-13 | Disposition: A | Discharge status: Home / Self Care | Payer: Managed Care, Other (non HMO) | Attending: Family Medicine | Admitting: Family Medicine

## 2016-07-13 ENCOUNTER — Ambulatory Visit (INDEPENDENT_AMBULATORY_CARE_PROVIDER_SITE_OTHER): Payer: Managed Care, Other (non HMO)

## 2016-07-13 ENCOUNTER — Encounter: Payer: Self-pay | Admitting: Emergency Medicine

## 2016-07-13 DIAGNOSIS — S29019A Strain of muscle and tendon of unspecified wall of thorax, initial encounter: Secondary | ICD-10-CM

## 2016-07-13 LAB — URINALYSIS COMPLETE WITH MICROSCOPIC (ARMC ONLY)
Bilirubin Urine: NEGATIVE
GLUCOSE, UA: NEGATIVE mg/dL
NITRITE: NEGATIVE
SPECIFIC GRAVITY, URINE: 1.02 (ref 1.005–1.030)
pH: 6 (ref 5.0–8.0)

## 2016-07-13 MED ORDER — KETOROLAC TROMETHAMINE 60 MG/2ML IM SOLN
60.0000 mg | Freq: Once | INTRAMUSCULAR | Status: AC
  Administered 2016-07-13: 60 mg via INTRAMUSCULAR

## 2016-07-13 MED ORDER — TIZANIDINE HCL 4 MG PO CAPS: 4.0000 mg | ORAL_CAPSULE | Freq: Three times a day (TID) | ORAL | 0 refills | Status: DC

## 2016-07-13 MED ORDER — NAPROXEN 500 MG PO TABS: 500.0000 mg | ORAL_TABLET | Freq: Two times a day (BID) | ORAL | 0 refills | Status: DC

## 2016-07-13 NOTE — ED Triage Notes (Signed)
Patient c/o lower back pain that started yesterday.  Patient denies injury or fall.

## 2016-07-13 NOTE — ED Provider Notes (Signed)
CSN: [id number]     Arrival date & time 07/13/16  1746 History   First MD Initiated Contact with Patient 07/13/16 1819     Chief Complaint  Patient presents with  . Back Pain   (Consider location/radiation/quality/duration/timing/severity/associated sxs/prior Treatment) HPI  This a 50 year old female accompanied by her sister who presents with increasing pain in her right thoracic area there she indicates the para spinal muscles at approximately T11-T12 area. Palpable muscle spasm in the area. Cough or sneeze especially. He has had no injury. She did help paint yesterday at her sister's house but mostly trim work. 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 Denies bowel or urinary symptoms.Currently on her menstrual cycle.       Past Medical History:  Diagnosis Date  . Anxiety   . Depression   . GERD (gastroesophageal reflux disease)    Past Surgical History:  Procedure Laterality Date  . ABDOMINAL SURGERY     History reviewed. No pertinent family history. Social History  Substance Use Topics  . Smoking status: Current Every Day Smoker    Packs/day: 0.50    Types: Cigarettes  . Smokeless tobacco: Never Used  . Alcohol use 0.0 oz/week     Comment: occasionally   OB History    No data available     Review of Systems  Constitutional: Positive for activity change. Negative for chills, fatigue and fever.  Musculoskeletal: Positive for back pain and myalgias.  All other systems reviewed and are negative.   Allergies  Patient has no known allergies.  Home Medications   Prior to Admission medications   Medication Sig Start Date End Date Taking? Authorizing Provider  ALPRAZolam Duanne Moron) 0.5 MG tablet Take 0.5 mg by mouth at bedtime as needed for anxiety.    Historical Provider, MD  HYDROcodone-acetaminophen (NORCO) 5-325 MG tablet Take 1 tablet by mouth every 8 (eight) hours as needed for moderate pain. 10/12/15   Frederich Cha, MD  meloxicam (MOBIC) 15 MG  tablet Take 1 tablet (15 mg total) by mouth daily. 10/12/15   Frederich Cha, MD  naproxen (NAPROSYN) 500 MG tablet Take 1 tablet (500 mg total) by mouth 2 (two) times daily with a meal. 07/13/16   Lorin Picket, PA-C  omeprazole (PRILOSEC) 20 MG capsule Take 20 mg by mouth daily.    Historical Provider, MD  ondansetron (ZOFRAN ODT) 4 MG disintegrating tablet Take 1 tablet (4 mg total) by mouth every 8 (eight) hours as needed for nausea or vomiting. 05/16/16   Paulina Fusi, MD  PARoxetine (PAXIL) 20 MG tablet TAKE ONE TABLET BY MOUTH ONCE DAILY 12/31/14   Historical Provider, MD  promethazine (PHENERGAN) 25 MG tablet Take 1 tablet (25 mg total) by mouth every 8 (eight) hours as needed for nausea or vomiting. 01/19/15   Max T Hyatt, DPM  tiZANidine (ZANAFLEX) 4 MG capsule Take 1 capsule (4 mg total) by mouth 3 (three) times daily. 07/13/16   Lorin Picket, PA-C   Meds Ordered and Administered this Visit   Medications  ketorolac (TORADOL) injection 60 mg (60 mg Intramuscular Given 07/13/16 1841)    BP 130/81 (BP Location: Left Arm)   Pulse 90   Temp 98.2 F (36.8 C) (Tympanic)   Resp 16   Ht 5' 7.5" (1.715 m)   Wt 190 lb (86.2 kg)   LMP 07/11/2016 (Exact Date)   SpO2 100%   BMI 29.32 kg/m  No data found.   Physical Exam  Constitutional: She  is oriented to person, place, and time. She appears well-developed and well-nourished. No distress.  HENT:  Head: Normocephalic and atraumatic.  Eyes: EOM are normal. Pupils are equal, round, and reactive to light.  Neck: Normal range of motion. Neck supple.  Pulmonary/Chest: Effort normal and breath sounds normal. No respiratory distress. She has no wheezes. She has no rales.  Musculoskeletal: She exhibits tenderness.  Examination of the spine was performed in the presence of Heather, RN as chaperone. Patient has spasm is visible and palpable in the right paraspinous muscles at T12 T11 level. She has increased range of motion for flexion and  extension; thoracic rotation is very painful and limited particularly to the left.  Neurological: She is alert and oriented to person, place, and time.  Skin: Skin is warm and dry. She is not diaphoretic.  Psychiatric: She has a normal mood and affect. Her behavior is normal. Judgment and thought content normal.  Nursing note and vitals reviewed.   Urgent Care Course   Clinical Course     Procedures (including critical care time)  Labs Review Labs Reviewed  URINALYSIS COMPLETEWITH MICROSCOPIC ([place] ONLY) - Abnormal; Notable for the following:       Result Value   APPearance HAZY (*)    Ketones, ur TRACE (*)    Hgb urine dipstick LARGE (*)    Protein, ur TRACE (*)    Leukocytes, UA TRACE (*)    Bacteria, UA FEW (*)    Squamous Epithelial / LPF 0-5 (*)    All other components within normal limits    Imaging Review Dg Thoracic Spine 2 View  Result Date: 07/13/2016 CLINICAL DATA:  Mid thoracic pain for 1 day EXAM: THORACIC SPINE 2 VIEWS COMPARISON:  None. FINDINGS: Vertebral body heights are grossly maintained. There are minimal degenerative changes. IMPRESSION: Mild degenerative changes.  No acute osseous abnormality. Electronically Signed   By: Donavan Foil M.D.   On: 07/13/2016 19:19     Visual Acuity Review  Right Eye Distance:   Left Eye Distance:   Bilateral Distance:    Right Eye Near:   Left Eye Near:    Bilateral Near:     Medications  ketorolac (TORADOL) injection 60 mg (60 mg Intramuscular Given 07/13/16 1841)      MDM   1. Thoracic myofascial strain, initial encounter    Discharge Medication List as of 07/13/2016  7:49 PM    START taking these medications   Details  naproxen (NAPROSYN) 500 MG tablet Take 1 tablet (500 mg total) by mouth 2 (two) times daily with a meal., Starting Fri 07/13/2016, Normal    tiZANidine (ZANAFLEX) 4 MG capsule Take 1 capsule (4 mg total) by mouth 3 (three) times daily., Starting Fri 07/13/2016, Normal       Plan: 1. Test/x-ray results and diagnosis reviewed with patient 2. rx as per orders; risks, benefits, potential side effects reviewed with patient 3. Recommend supportive treatment with symptom avoidance. I told her  that this is unlikely a kidney stone but she should keep this in mind. Exam today supports a thoracic strain. Claimed that the red blood cells in her urine or most likely from her period. Start her on anti-inflammatory and exercise. Encouraged her to follow-up with her primary care physician next week. She should go to the emergency room if her pain worsens over the weekend. 4. F/u prn if symptoms worsen or don't improve     Lorin Picket, PA-C 07/13/16 2020

## 2016-08-30 ENCOUNTER — Encounter: Payer: Self-pay | Admitting: *Deleted

## 2016-08-30 ENCOUNTER — Ambulatory Visit
Admission: EM | Admit: 2016-08-30 | Discharge: 2016-08-30 | Disposition: A | Discharge status: Home / Self Care | Payer: Managed Care, Other (non HMO) | Attending: Emergency Medicine | Admitting: Emergency Medicine

## 2016-08-30 DIAGNOSIS — B349 Viral infection, unspecified: Secondary | ICD-10-CM | POA: Diagnosis not present

## 2016-08-30 LAB — RAPID STREP SCREEN (MED CTR MEBANE ONLY): Streptococcus, Group A Screen (Direct): NEGATIVE

## 2016-08-30 LAB — RAPID INFLUENZA A&B ANTIGENS (ARMC ONLY)
INFLUENZA A (ARMC): NEGATIVE
INFLUENZA B (ARMC): NEGATIVE

## 2016-08-30 MED ORDER — ONDANSETRON 8 MG PO TBDP
8.0000 mg | ORAL_TABLET | Freq: Once | ORAL | Status: AC
  Administered 2016-08-30: 8 mg via ORAL

## 2016-08-30 MED ORDER — ONDANSETRON 4 MG PO TBDP: 4.0000 mg | ORAL_TABLET | Freq: Three times a day (TID) | ORAL | 0 refills | Status: DC | PRN

## 2016-08-30 NOTE — ED Provider Notes (Signed)
[facility] ____________________________________________  Time seen: Approximately 9:12 AM  I have reviewed the triage vital signs and the nursing notes.   HISTORY  Chief Complaint Sore Throat; Fever; Nausea; and Emesis   HPI Theresa Sanders is a 50 y.o. female presenting for the complaints of recent nausea, vomiting and diarrhea, now with runny nose, nasal congestion and sore throat. Patient reports that this past Monday she had several episodes of nausea and vomiting. Reports then Tuesday she had several episodes of diarrhea. States yesterday she will follow-up with runny nose, nasal congestion, sore throat and cough that has continued with intermittent body aches. Denies any more vomiting or diarrhea. Patient reports she has been able to tolerate oral fluids, but decreased appetite. Denies any accompanying abdominal pain. Patient reports that she feels somewhat drained, and need a work note for tomorrow. 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 Denies any abnormal colored vomit or stool, hematochezia, melena, hemoptysis. Denies recent sickness. Denies chest pain, shortness of breath, abdominal pain, dysuria, vaginal complaints, extremity pain or extremity swelling.  Patient's last menstrual period was 08/23/2016 (exact date). Denies pregnancy. Sallee Lange, NP: PCP    Past Medical History:  Diagnosis Date  . Anxiety   . Depression   . GERD (gastroesophageal reflux disease)     Patient Active Problem List   Diagnosis Date Noted  . Anxiety 01/03/2015  . Clinical depression 01/03/2015  . Diverticulitis 01/03/2015  . Gout 01/03/2015  . HPV test positive 01/03/2015  . HLD (hyperlipidemia) 01/03/2015  . Avitaminosis D 01/03/2015    Past Surgical History:  Procedure Laterality Date  . ABDOMINAL SURGERY    . FOOT SURGERY Bilateral     No current facility-administered medications for this encounter.   Current Outpatient  Prescriptions:  .  ALPRAZolam (XANAX) 0.5 MG tablet, Take 0.5 mg by mouth at bedtime as needed for anxiety., Disp: , Rfl:  .  omeprazole (PRILOSEC) 20 MG capsule, Take 20 mg by mouth daily., Disp: , Rfl:  .  PARoxetine (PAXIL) 20 MG tablet, TAKE ONE TABLET BY MOUTH ONCE DAILY, Disp: , Rfl:  .  HYDROcodone-acetaminophen (NORCO) 5-325 MG tablet, Take 1 tablet by mouth every 8 (eight) hours as needed for moderate pain., Disp: 20 tablet, Rfl: 0 .  meloxicam (MOBIC) 15 MG tablet, Take 1 tablet (15 mg total) by mouth daily., Disp: 30 tablet, Rfl: 0 .  naproxen (NAPROSYN) 500 MG tablet, Take 1 tablet (500 mg total) by mouth 2 (two) times daily with a meal., Disp: 60 tablet, Rfl: 0 .  ondansetron (ZOFRAN ODT) 4 MG disintegrating tablet, Take 1 tablet (4 mg total) by mouth every 8 (eight) hours as needed for nausea or vomiting., Disp: 15 tablet, Rfl: 0 .  promethazine (PHENERGAN) 25 MG tablet, Take 1 tablet (25 mg total) by mouth every 8 (eight) hours as needed for nausea or vomiting., Disp: 20 tablet, Rfl: 0 .  tiZANidine (ZANAFLEX) 4 MG capsule, Take 1 capsule (4 mg total) by mouth 3 (three) times daily., Disp: 21 capsule, Rfl: 0  Allergies Patient has no known allergies.  History reviewed. No pertinent family history.  Social History Social History  Substance Use Topics  . Smoking status: Current Every Day Smoker    Packs/day: 0.50    Types: Cigarettes  . Smokeless tobacco: Never Used  . Alcohol use 0.0 oz/week     Comment: occasionally    Review of Systems Constitutional: As above.  Eyes: No visual  changes. ENT: No sore throat.  Cardiovascular: Denies chest pain. Respiratory: Denies shortness of breath. Gastrointestinal: No abdominal pain. No constipation. Genitourinary: Negative for dysuria. Musculoskeletal: Negative for back pain. Skin: Negative for rash. Neurological: Negative for headaches, focal weakness or numbness.  10-point ROS otherwise  negative.  ____________________________________________   PHYSICAL EXAM:  VITAL SIGNS: ED Triage Vitals  Enc Vitals Group     BP 08/30/16 0842 132/79     Pulse Rate 08/30/16 0842 72     Resp 08/30/16 0842 16     Temp 08/30/16 0842 98.6 F (37 C)     Temp Source 08/30/16 0842 Oral     SpO2 08/30/16 0842 99 %     Weight 08/30/16 0843 190 lb (86.2 kg)     Height 08/30/16 0843 5\' 10"  (1.778 m)     Head Circumference --      Peak Flow --      Pain Score --      Pain Loc --      Pain Edu? --      Excl. in [place]? --    Constitutional: Alert and oriented. Well appearing and in no acute distress. Eyes: Conjunctivae are normal. PERRL. EOMI. Head: Atraumatic. No sinus tenderness to palpation. No swelling. No erythema.  Ears: no erythema, normal TMs bilaterally.   Nose:Nasal congestion with clear rhinorrhea  Mouth/Throat: Mucous membranes are moist. Mild pharyngeal erythema. No tonsillar swelling or exudate.  Neck: No stridor.  No cervical spine tenderness to palpation. Hematological/Lymphatic/Immunilogical:Mild anterior bilateral cervical lymphadenopathy. Cardiovascular: Normal rate, regular rhythm. Grossly normal heart sounds.  Good peripheral circulation. Respiratory: Normal respiratory effort.  No retractions. Lungs CTAB.No wheezes, rales or rhonchi. Good air movement.  Gastrointestinal: Soft and nontender. Normal Bowel sounds. No CVA tenderness. Musculoskeletal: Ambulatory with steady gait. No cervical, thoracic or lumbar tenderness to palpation. Neurologic:  Normal speech and language. No gait instability. Skin:  Skin is warm, dry and intact. No rash noted. Psychiatric: Mood and affect are normal. Speech and behavior are normal.  ___________________________________________   LABS (all labs ordered are listed, but only abnormal results are displayed)  Labs Reviewed  RAPID STREP SCREEN (NOT AT [facility])  RAPID INFLUENZA A&B ANTIGENS ([facility] ONLY)  CULTURE, GROUP A STREP [facility])      PROCEDURES Procedures   INITIAL IMPRESSION / ASSESSMENT AND PLAN / ED COURSE  Pertinent labs & imaging results that were available during my care of the patient were reviewed by me and considered in my medical decision making (see chart for details).  Well appearing patient, no acute distress. Presenting for recent nausea, vomiting and diarrhea that has now resolved, but now with nasal congestion, cough and sore throat. Tolerating fluids at home. Suspect viral illness. Quick strep negative, will culture. Influenza negative. Encourage rest, fluids, supportive care, BRAT diet. When necessary Zofran. Patient states she'll take over-the-counter cough or congestion medications as needed. Discussed indication, risks and benefits of medications with patient.  Discussed follow up with Primary care physician this week. Discussed follow up and return parameters including no resolution or any worsening concerns. Patient verbalized understanding and agreed to plan.   ____________________________________________   FINAL CLINICAL IMPRESSION(S) / ED DIAGNOSES  Final diagnoses:  Viral illness     New Prescriptions   ONDANSETRON (ZOFRAN ODT) 4 MG DISINTEGRATING TABLET    Take 1 tablet (4 mg total) by mouth every 8 (eight) hours as needed for nausea or vomiting.    Note: This dictation was prepared with Dragon dictation along  with smaller phrase technology. Any transcriptional errors that result from this process are unintentional.    Clinical Course       Marylene Land, NP 08/30/16 0930

## 2016-08-30 NOTE — ED Triage Notes (Signed)
Onset of N/V/D Monday, since has developed sore throat, fever, chills, body aches, productive cough- yellow, ear fullness. Worse today.

## 2016-08-30 NOTE — Discharge Instructions (Signed)
Take medication as prescribed. Rest. Drink plenty of fluids.  ° °Follow up with your primary care physician this week as needed. Return to Urgent care for new or worsening concerns.  ° °

## 2016-09-01 LAB — CULTURE, GROUP A STREP (THRC)

## 2016-09-02 ENCOUNTER — Telehealth: Payer: Self-pay | Admitting: Emergency Medicine

## 2016-09-02 MED ORDER — AMOXICILLIN 875 MG PO TABS: 875.0000 mg | ORAL_TABLET | Freq: Two times a day (BID) | ORAL | 0 refills | Status: AC

## 2016-09-02 MED ORDER — AMOXICILLIN 875 MG PO TABS: 875.0000 mg | ORAL_TABLET | Freq: Two times a day (BID) | ORAL | 0 refills | Status: DC

## 2016-09-02 NOTE — Telephone Encounter (Signed)
Tried calling patient- no answer and unable to leave a message due to voice mail box being full.  Patient's throat culture came back positive for Strep.  Dr. Alveta Heimlich reviewed result and would like Amoxicillin 875mg  1 tablet twice a day for 10 days to be sent to patient's pharmacy. Prescription sent to [facility] in [place].  Patient to follow-up with PCP if symptoms do not improve or worsen.

## 2016-10-10 ENCOUNTER — Ambulatory Visit
Admission: EM | Admit: 2016-10-10 | Discharge: 2016-10-10 | Disposition: A | Discharge status: Home / Self Care | Payer: Managed Care, Other (non HMO) | Attending: Family Medicine | Admitting: Family Medicine

## 2016-10-10 DIAGNOSIS — R69 Illness, unspecified: Secondary | ICD-10-CM | POA: Diagnosis not present

## 2016-10-10 DIAGNOSIS — J111 Influenza due to unidentified influenza virus with other respiratory manifestations: Secondary | ICD-10-CM

## 2016-10-10 MED ORDER — OSELTAMIVIR PHOSPHATE 75 MG PO CAPS: 75.0000 mg | ORAL_CAPSULE | Freq: Two times a day (BID) | ORAL | 0 refills | Status: DC

## 2016-10-10 MED ORDER — HYDROCOD POLST-CPM POLST ER 10-8 MG/5ML PO SUER: 5.0000 mL | Freq: Every evening | ORAL | 0 refills | Status: DC | PRN

## 2016-10-10 MED ORDER — IBUPROFEN 800 MG PO TABS
800.0000 mg | ORAL_TABLET | Freq: Once | ORAL | Status: AC
  Administered 2016-10-10: 800 mg via ORAL

## 2016-10-10 NOTE — ED Triage Notes (Signed)
Patient complains of cough, fever, headache, congestion and body aches that started Sunday pm.

## 2016-10-10 NOTE — Discharge Instructions (Signed)
Take medication as prescribed. Rest. Drink plenty of fluids.  ° °Follow up with your primary care physician this week as needed. Return to Urgent care for new or worsening concerns.  ° °

## 2016-10-10 NOTE — ED Provider Notes (Signed)
[facility] ____________________________________________  Time seen: Approximately 8:59 AM  I have reviewed the triage vital signs and the nursing notes.   HISTORY  Chief Complaint Cough   HPI Theresa Sanders is a 51 y.o. female presenting for the complaints of chills, body aches, intermittent headache, nasal congestion, cough that started on Monday. Patient states Sunday she didn't really feel well and felt as if she was getting sick, but reports symptoms are present upon awakening on Monday. Patient reports intermittent fever since Monday night. Patient reports fever maximum 101 orally. Reports last took Tylenol at about 1 AM this morning. No medications otherwise taken this morning. Reports continues to drink fluids well, decreased appetite. States cough is a dry hacking cough, nonproductive. Denies sick contacts at home, but reports does work at [employer] and exposed to sick people in the community.  Denies chest pain, shortness of breath, abdominal pain, dysuria, extremity pain, extremity swelling or rash. Denies recent sickness. Denies recent antibiotic use.   Sallee Lange, NP: PCP Patient's last menstrual period was 09/26/2016. Denies pregnancy.   Past Medical History:  Diagnosis Date  . Anxiety   . Depression   . GERD (gastroesophageal reflux disease)     Patient Active Problem List   Diagnosis Date Noted  . Anxiety 01/03/2015  . Clinical depression 01/03/2015  . Diverticulitis 01/03/2015  . Gout 01/03/2015  . HPV test positive 01/03/2015  . HLD (hyperlipidemia) 01/03/2015  . Avitaminosis D 01/03/2015    Past Surgical History:  Procedure Laterality Date  . ABDOMINAL SURGERY    . FOOT SURGERY Bilateral      No current facility-administered medications for this encounter.   Current Outpatient Prescriptions:  .  ALPRAZolam (XANAX) 0.5 MG tablet, Take 0.5 mg by mouth at bedtime as needed for anxiety., Disp: , Rfl:  .  naproxen  (NAPROSYN) 500 MG tablet, Take 1 tablet (500 mg total) by mouth 2 (two) times daily with a meal., Disp: 60 tablet, Rfl: 0 .  omeprazole (PRILOSEC) 20 MG capsule, Take 20 mg by mouth daily., Disp: , Rfl:  .  ondansetron (ZOFRAN ODT) 4 MG disintegrating tablet, Take 1 tablet (4 mg total) by mouth every 8 (eight) hours as needed for nausea or vomiting., Disp: 15 tablet, Rfl: 0 .  PARoxetine (PAXIL) 20 MG tablet, TAKE ONE TABLET BY MOUTH ONCE DAILY, Disp: , Rfl:  .  tiZANidine (ZANAFLEX) 4 MG capsule, Take 1 capsule (4 mg total) by mouth 3 (three) times daily., Disp: 21 capsule, Rfl: 0 .  chlorpheniramine-HYDROcodone (TUSSIONEX PENNKINETIC ER) 10-8 MG/5ML SUER, Take 5 mLs by mouth at bedtime as needed for cough. do not drive or operate machinery while taking as can cause drowsiness., Disp: 75 mL, Rfl: 0 .  HYDROcodone-acetaminophen (NORCO) 5-325 MG tablet, Take 1 tablet by mouth every 8 (eight) hours as needed for moderate pain., Disp: 20 tablet, Rfl: 0 .  meloxicam (MOBIC) 15 MG tablet, Take 1 tablet (15 mg total) by mouth daily., Disp: 30 tablet, Rfl: 0 .  oseltamivir (TAMIFLU) 75 MG capsule, Take 1 capsule (75 mg total) by mouth every 12 (twelve) hours., Disp: 10 capsule, Rfl: 0 .  promethazine (PHENERGAN) 25 MG tablet, Take 1 tablet (25 mg total) by mouth every 8 (eight) hours as needed for nausea or vomiting., Disp: 20 tablet, Rfl: 0  Allergies Patient has no known allergies.  History reviewed. No pertinent family history.  Social History Social History  Substance Use Topics  . Smoking status: Current Every  Day Smoker    Packs/day: 0.50    Types: Cigarettes  . Smokeless tobacco: Never Used  . Alcohol use 0.0 oz/week     Comment: occasionally    Review of Systems Constitutional: As above. Eyes: No visual changes. ENT: No sore throat. Cardiovascular: Denies chest pain. Respiratory: Denies shortness of breath. Gastrointestinal: No abdominal pain.  No nausea, no vomiting.  No diarrhea.   No constipation. Genitourinary: Negative for dysuria. Musculoskeletal: Negative for back pain. Skin: Negative for rash. Neurological: Negative for headaches, focal weakness or numbness.  10-point ROS otherwise negative.  ____________________________________________   PHYSICAL EXAM:  VITAL SIGNS: ED Triage Vitals  Enc Vitals Group     BP 10/10/16 0830 (!) 156/86     Pulse Rate 10/10/16 0830 74     Resp 10/10/16 0830 17     Temp 10/10/16 0830 (!) 100.5 F (38.1 C)     Temp Source 10/10/16 0830 Oral     SpO2 10/10/16 0830 100 %     Weight 10/10/16 0828 190 lb (86.2 kg)     Height 10/10/16 0828 5\' 7"  (1.702 m)     Head Circumference --      Peak Flow --      Pain Score 10/10/16 0830 7     Pain Loc --      Pain Edu? --      Excl. in [place]? --   Constitutional: Alert and oriented. Well appearing and in no acute distress. Eyes: Conjunctivae are normal. PERRL. EOMI. Head: Atraumatic. No sinus tenderness to palpation. No swelling. No erythema.  Ears: no erythema, normal TMs bilaterally.   Nose:Nasal congestion with clear rhinorrhea  Mouth/Throat: Mucous membranes are moist. No pharyngeal erythema. No tonsillar swelling or exudate.  Neck: No stridor.  No cervical spine tenderness to palpation. Hematological/Lymphatic/Immunilogical: No cervical lymphadenopathy. Cardiovascular: Normal rate, regular rhythm. Grossly normal heart sounds.  Good peripheral circulation. Respiratory: Normal respiratory effort.  No retractions. No wheezes, rales or rhonchi. Good air movement.  Gastrointestinal: Soft and nontender. Normal Bowel sounds. No CVA tenderness. Musculoskeletal: Ambulatory with steady gait. No cervical, thoracic or lumbar tenderness to palpation. Neurologic:  Normal speech and language. No gait instability. Skin:  Skin appears warm, dry and intact. No rash noted. Psychiatric: Mood and affect are normal. Speech and behavior are normal. ___________________________________________     LABS (all labs ordered are listed, but only abnormal results are displayed)  Labs Reviewed - No data to display ____________________________________________   PROCEDURES Procedures    INITIAL IMPRESSION / ASSESSMENT AND PLAN / ED COURSE  Pertinent labs & imaging results that were available during my care of the patient were reviewed by me and considered in my medical decision making (see chart for details).  Well-appearing patient. No acute distress. Suspect influenza. Discussed evaluation and treatment options with patient. Will treat patient with oral Tamiflu and when necessary Tussionex. 800 mg ibuprofen given once in urgent care. Encouraged supportive care, rest, fluids and PCP follow-up as needed. Work note given for today and tomorrow.  Discussed follow up with Primary care physician this week. Discussed follow up and return parameters including no resolution or any worsening concerns. Patient verbalized understanding and agreed to plan.   ____________________________________________   FINAL CLINICAL IMPRESSION(S) / ED DIAGNOSES  Final diagnoses:  Influenza-like illness     Discharge Medication List as of 10/10/2016  8:53 AM    START taking these medications   Details  chlorpheniramine-HYDROcodone (TUSSIONEX PENNKINETIC ER) 10-8 MG/5ML SUER Take 5 mLs  by mouth at bedtime as needed for cough. do not drive or operate machinery while taking as can cause drowsiness., Starting Wed 10/10/2016, Print    oseltamivir (TAMIFLU) 75 MG capsule Take 1 capsule (75 mg total) by mouth every 12 (twelve) hours., Starting Wed 10/10/2016, Normal        Note: This dictation was prepared with Dragon dictation along with smaller phrase technology. Any transcriptional errors that result from this process are unintentional.         Marylene Land, NP 10/10/16 [phone]

## 2016-12-19 ENCOUNTER — Encounter: Payer: Self-pay | Admitting: *Deleted

## 2016-12-19 ENCOUNTER — Ambulatory Visit
Admission: EM | Admit: 2016-12-19 | Discharge: 2016-12-19 | Disposition: A | Discharge status: Home / Self Care | Payer: Managed Care, Other (non HMO) | Attending: Family Medicine | Admitting: Family Medicine

## 2016-12-19 DIAGNOSIS — L03314 Cellulitis of groin: Secondary | ICD-10-CM

## 2016-12-19 DIAGNOSIS — L02214 Cutaneous abscess of groin: Secondary | ICD-10-CM

## 2016-12-19 MED ORDER — CEFTRIAXONE SODIUM 1 G IJ SOLR
1.0000 g | Freq: Once | INTRAMUSCULAR | Status: AC
  Administered 2016-12-19: 1 g via INTRAMUSCULAR

## 2016-12-19 MED ORDER — MUPIROCIN 2 % EX OINT: TOPICAL_OINTMENT | CUTANEOUS | 0 refills | Status: DC

## 2016-12-19 MED ORDER — OXYCODONE-ACETAMINOPHEN 5-325 MG PO TABS: 1.0000 | ORAL_TABLET | Freq: Three times a day (TID) | ORAL | 0 refills | Status: DC | PRN

## 2016-12-19 MED ORDER — SULFAMETHOXAZOLE-TRIMETHOPRIM 800-160 MG PO TABS: 1.0000 | ORAL_TABLET | Freq: Two times a day (BID) | ORAL | 0 refills | Status: AC

## 2016-12-19 NOTE — ED Provider Notes (Signed)
[facility] ____________________________________________  Time seen: Approximately 09:13 AM  I have reviewed the triage vital signs and the nursing notes.   HISTORY  Chief Complaint Abscess   HPI Theresa Sanders is a 51 y.o. female presenting for evaluation of left groin abscess. Patient reports this onset was this past Sunday and has progressively worsened. Reports has had some drainage yesterday and today. Patient reports has been applying warm compresses intermittently as well as a draining type salve. Denies history of similar in the past. Denies known history of MRSA. Patient states that she recently went to the beach and was shaving this area more and thinks that this causes an ingrown hair. Patient reports that the area is very tender to touch as well as with movement. Denies any vaginal, urinary or pelvic discomfort. Denies any dysuria or vaginal discharge. Denies any other rash or skin changes. Denies fevers. Reports continues to eat and drink well. States has been taking over-the-counter Tylenol as well.  Denies chest pain, shortness of breath, abdominal pain, dysuria, or other rash. Denies recent sickness. Denies recent antibiotic use.   Sallee Lange, NP: PCP Patient's last menstrual period was 12/05/2016 (exact date). Denies pregnancy   Past Medical History:  Diagnosis Date  . Anxiety   . Depression   . GERD (gastroesophageal reflux disease)     Patient Active Problem List   Diagnosis Date Noted  . Anxiety 01/03/2015  . Clinical depression 01/03/2015  . Diverticulitis 01/03/2015  . Gout 01/03/2015  . HPV test positive 01/03/2015  . HLD (hyperlipidemia) 01/03/2015  . Avitaminosis D 01/03/2015    Past Surgical History:  Procedure Laterality Date  . ABDOMINAL SURGERY    . FOOT SURGERY Bilateral      No current facility-administered medications for this encounter.   Current Outpatient Prescriptions:  .  ALPRAZolam (XANAX) 0.5 MG tablet,  Take 0.5 mg by mouth at bedtime as needed for anxiety., Disp: , Rfl:  .  amitriptyline (ELAVIL) 10 MG tablet, Take 10 mg by mouth at bedtime., Disp: , Rfl:  .  omeprazole (PRILOSEC) 20 MG capsule, Take 20 mg by mouth daily., Disp: , Rfl:  .  PARoxetine (PAXIL) 20 MG tablet, TAKE ONE TABLET BY MOUTH ONCE DAILY, Disp: , Rfl:  .  chlorpheniramine-HYDROcodone (TUSSIONEX PENNKINETIC ER) 10-8 MG/5ML SUER, Take 5 mLs by mouth at bedtime as needed for cough. do not drive or operate machinery while taking as can cause drowsiness., Disp: 75 mL, Rfl: 0 .  HYDROcodone-acetaminophen (NORCO) 5-325 MG tablet, Take 1 tablet by mouth every 8 (eight) hours as needed for moderate pain., Disp: 20 tablet, Rfl: 0 .  meloxicam (MOBIC) 15 MG tablet, Take 1 tablet (15 mg total) by mouth daily., Disp: 30 tablet, Rfl: 0 .  mupirocin ointment (BACTROBAN) 2 %, Apply three times a day for 7 days., Disp: 22 g, Rfl: 0 .  naproxen (NAPROSYN) 500 MG tablet, Take 1 tablet (500 mg total) by mouth 2 (two) times daily with a meal., Disp: 60 tablet, Rfl: 0 .  ondansetron (ZOFRAN ODT) 4 MG disintegrating tablet, Take 1 tablet (4 mg total) by mouth every 8 (eight) hours as needed for nausea or vomiting., Disp: 15 tablet, Rfl: 0 .  oseltamivir (TAMIFLU) 75 MG capsule, Take 1 capsule (75 mg total) by mouth every 12 (twelve) hours., Disp: 10 capsule, Rfl: 0 .  oxyCODONE-acetaminophen (ROXICET) 5-325 MG tablet, Take 1 tablet by mouth every 8 (eight) hours as needed for moderate pain or severe pain (  Do not drive or operate heavy machinery while taking as can cause drowsiness.)., Disp: 6 tablet, Rfl: 0 .  promethazine (PHENERGAN) 25 MG tablet, Take 1 tablet (25 mg total) by mouth every 8 (eight) hours as needed for nausea or vomiting., Disp: 20 tablet, Rfl: 0 .  sulfamethoxazole-trimethoprim (BACTRIM DS,SEPTRA DS) 800-160 MG tablet, Take 1 tablet by mouth 2 (two) times daily., Disp: 20 tablet, Rfl: 0 .  tiZANidine (ZANAFLEX) 4 MG capsule, Take 1  capsule (4 mg total) by mouth 3 (three) times daily., Disp: 21 capsule, Rfl: 0  Allergies Patient has no known allergies.  History reviewed. No pertinent family history.  Social History Social History  Substance Use Topics  . Smoking status: Current Every Day Smoker    Packs/day: 0.50    Types: Cigarettes  . Smokeless tobacco: Never Used  . Alcohol use 0.0 oz/week     Comment: occasionally    Review of Systems Constitutional: No fever/chills Cardiovascular: Denies chest pain. Respiratory: Denies shortness of breath. Gastrointestinal: No abdominal pain.  No nausea, no vomiting.  No diarrhea.  No constipation. Genitourinary: Negative for dysuria. Musculoskeletal: Negative for back pain. Skin: As above   ____________________________________________   PHYSICAL EXAM:  VITAL SIGNS: ED Triage Vitals  Enc Vitals Group     BP 12/19/16 0839 (!) 151/92     Pulse Rate 12/19/16 0839 82     Resp 12/19/16 0839 16     Temp 12/19/16 0839 98.5 F (36.9 C)     Temp Source 12/19/16 0839 Oral     SpO2 12/19/16 0839 99 %     Weight 12/19/16 0841 190 lb (86.2 kg)     Height 12/19/16 0841 5\' 8"  (1.727 m)     Head Circumference --      Peak Flow --      Pain Score --      Pain Loc --      Pain Edu? --      Excl. in [place]? --     Constitutional: Alert and oriented. Well appearing and in no acute distress. ENT      Head: Normocephalic and atraumatic. Cardiovascular: Normal rate, regular rhythm. Grossly normal heart sounds.  Good peripheral circulation. Respiratory: Normal respiratory effort without tachypnea nor retractions. Breath sounds are clear and equal bilaterally. No wheezes, rales, rhonchi. Gastrointestinal: Soft and nontender.  Musculoskeletal: steady gait.  Neurologic:  Normal speech and language. Speech is normal. No gait instability.  Skin:  Skin is warm, dry.  Except: Exam completed with Melissa CMA at bedside as chaperone. Left inguinal crease area of approximately 2.5 x  2.5 cm area of induration with localized surrounding erythema, no further surrounding induration, no fluctuance, no pointing, moderate tenderness to direct palpation, no vulvar or perineal appearance of involvement, full range of motion to lower extremities.  Psychiatric: Mood and affect are normal. Speech and behavior are normal. Patient exhibits appropriate insight and judgment   ___________________________________________   LABS (all labs ordered are listed, but only abnormal results are displayed)  Labs Reviewed - No data to display   PROCEDURES Procedures     INITIAL IMPRESSION / ASSESSMENT AND PLAN / ED COURSE  Pertinent labs & imaging results that were available during my care of the patient were reviewed by me and considered in my medical decision making (see chart for details).  Well-appearing patient. No acute distress. Patient with left groin abscess with left groin and localized cellulitis, abscess is non-fluctuant and nonpointing. Discussed with patient and no  clear incision and drainage indication indicated at this time. Recommend starting patient on oral Bactrim, topical Bactroban, apply warm compresses, keeping clean. When necessary Percocet as needed for breakthrough pain. Discussed strict follow-up and return parameters with patient.Discussed indication, risks and benefits of medications with patient.   [place] controlled substance database reviewed, Xanax 0.5 mg tablets last received 3/22 then 10/23/16, and Tussionex prescription 10/10/2016.  Discussed follow up with Primary care physician this week. Discussed follow up and return parameters including no resolution or any worsening concerns. Patient verbalized understanding and agreed to plan.   ____________________________________________   FINAL CLINICAL IMPRESSION(S) / ED DIAGNOSES  Final diagnoses:  Abscess of left groin  Cellulitis of left groin     Discharge Medication List as of 12/19/2016  9:21 AM     START taking these medications   Details  mupirocin ointment (BACTROBAN) 2 % Apply three times a day for 7 days., Normal    oxyCODONE-acetaminophen (ROXICET) 5-325 MG tablet Take 1 tablet by mouth every 8 (eight) hours as needed for moderate pain or severe pain (Do not drive or operate heavy machinery while taking as can cause drowsiness.)., Starting Wed 12/19/2016, Print    sulfamethoxazole-trimethoprim (BACTRIM DS,SEPTRA DS) 800-160 MG tablet Take 1 tablet by mouth 2 (two) times daily., Starting Wed 12/19/2016, Until Wed 12/26/2016, Normal        Note: This dictation was prepared with Dragon dictation along with smaller phrase technology. Any transcriptional errors that result from this process are unintentional.         Marylene Land, NP 12/19/16 1111

## 2016-12-19 NOTE — ED Triage Notes (Signed)
Abcess to left groin x2 days.

## 2016-12-19 NOTE — Discharge Instructions (Signed)
Take medication as prescribed. Rest. Drink plenty of fluids. Keep clean.  ° °Follow up with your primary care physician this week as needed. Return to Urgent care for new or worsening concerns.  ° °

## 2017-02-26 ENCOUNTER — Ambulatory Visit
Admission: EM | Admit: 2017-02-26 | Discharge: 2017-02-26 | Disposition: A | Discharge status: Home / Self Care | Payer: BLUE CROSS/BLUE SHIELD | Attending: Emergency Medicine | Admitting: Emergency Medicine

## 2017-02-26 DIAGNOSIS — R1032 Left lower quadrant pain: Secondary | ICD-10-CM | POA: Diagnosis not present

## 2017-02-26 DIAGNOSIS — N760 Acute vaginitis: Secondary | ICD-10-CM | POA: Diagnosis not present

## 2017-02-26 DIAGNOSIS — B9689 Other specified bacterial agents as the cause of diseases classified elsewhere: Secondary | ICD-10-CM

## 2017-02-26 DIAGNOSIS — B373 Candidiasis of vulva and vagina: Secondary | ICD-10-CM

## 2017-02-26 DIAGNOSIS — B3731 Acute candidiasis of vulva and vagina: Secondary | ICD-10-CM

## 2017-02-26 LAB — URINALYSIS, COMPLETE (UACMP) WITH MICROSCOPIC
BILIRUBIN URINE: NEGATIVE
Glucose, UA: NEGATIVE mg/dL
KETONES UR: NEGATIVE mg/dL
LEUKOCYTES UA: NEGATIVE
NITRITE: NEGATIVE
PH: 7.5 (ref 5.0–8.0)
PROTEIN: NEGATIVE mg/dL
Specific Gravity, Urine: 1.015 (ref 1.005–1.030)

## 2017-02-26 LAB — CBC WITH DIFFERENTIAL/PLATELET
BASOS PCT: 1 %
Basophils Absolute: 0.1 10*3/uL (ref 0–0.1)
Eosinophils Absolute: 0.2 10*3/uL (ref 0–0.7)
Eosinophils Relative: 3 %
HEMATOCRIT: 37 % (ref 35.0–47.0)
HEMOGLOBIN: 12.2 g/dL (ref 12.0–16.0)
LYMPHS ABS: 2 10*3/uL (ref 1.0–3.6)
Lymphocytes Relative: 27 %
MCH: 27.4 pg (ref 26.0–34.0)
MCHC: 32.9 g/dL (ref 32.0–36.0)
MCV: 83.1 fL (ref 80.0–100.0)
MONOS PCT: 6 %
Monocytes Absolute: 0.4 10*3/uL (ref 0.2–0.9)
NEUTROS ABS: 4.7 10*3/uL (ref 1.4–6.5)
NEUTROS PCT: 63 %
Platelets: 259 10*3/uL (ref 150–440)
RBC: 4.45 MIL/uL (ref 3.80–5.20)
RDW: 15.9 % — ABNORMAL HIGH (ref 11.5–14.5)
WBC: 7.5 10*3/uL (ref 3.6–11.0)

## 2017-02-26 LAB — COMPREHENSIVE METABOLIC PANEL
ALT: 50 U/L (ref 14–54)
ANION GAP: 8 (ref 5–15)
AST: 49 U/L — ABNORMAL HIGH (ref 15–41)
Albumin: 4 g/dL (ref 3.5–5.0)
Alkaline Phosphatase: 72 U/L (ref 38–126)
BUN: 9 mg/dL (ref 6–20)
CHLORIDE: 103 mmol/L (ref 101–111)
CO2: 24 mmol/L (ref 22–32)
Calcium: 8.8 mg/dL — ABNORMAL LOW (ref 8.9–10.3)
Creatinine, Ser: 0.71 mg/dL (ref 0.44–1.00)
GFR calc non Af Amer: >60 mL/min (ref >60)
Glucose, Bld: 110 mg/dL — ABNORMAL HIGH (ref 65–99)
Potassium: 4.3 mmol/L (ref 3.5–5.1)
SODIUM: 135 mmol/L (ref 135–145)
Total Bilirubin: 0.7 mg/dL (ref 0.3–1.2)
Total Protein: 7.9 g/dL (ref 6.5–8.1)

## 2017-02-26 LAB — LIPASE, BLOOD: LIPASE: 31 U/L (ref 11–51)

## 2017-02-26 LAB — CHLAMYDIA/NGC RT PCR (ARMC ONLY)
Chlamydia Tr: NOT DETECTED
N gonorrhoeae: NOT DETECTED

## 2017-02-26 LAB — WET PREP, GENITAL
SPERM: NONE SEEN
Trich, Wet Prep: NONE SEEN

## 2017-02-26 LAB — PREGNANCY, URINE: Preg Test, Ur: NEGATIVE

## 2017-02-26 MED ORDER — IBUPROFEN 600 MG PO TABS: 600.0000 mg | ORAL_TABLET | Freq: Four times a day (QID) | ORAL | 0 refills | Status: DC | PRN

## 2017-02-26 MED ORDER — METRONIDAZOLE 500 MG PO TABS: 500.0000 mg | ORAL_TABLET | Freq: Two times a day (BID) | ORAL | 0 refills | Status: AC

## 2017-02-26 MED ORDER — FLUCONAZOLE 150 MG PO TABS: 150.0000 mg | ORAL_TABLET | Freq: Once | ORAL | 1 refills | Status: AC

## 2017-02-26 MED ORDER — CIPROFLOXACIN HCL 500 MG PO TABS: 500.0000 mg | ORAL_TABLET | Freq: Two times a day (BID) | ORAL | 0 refills | Status: DC

## 2017-02-26 NOTE — ED Triage Notes (Signed)
Patient complains of lower abdominal pain. Patient states that she has a history of diverticulitis and is concerned that this a flare of it. Patient states that lower abdominal pain radiates from left flank to back. Patient states that she has had fever and chills and no energy. Patient denies diarrhea.

## 2017-02-26 NOTE — ED Provider Notes (Signed)
HPI  SUBJECTIVE:  Theresa Sanders is a 51 y.o. female who presents with 5 days of constant achy low abdominal pain worse in the left lower quadrant, malaise and decreased appetite. States the pain got acutely worse yesterday. She reports having GERD symptoms with one episode of vomiting several days ago, but none since. She does report some vaginal spotting states that this is not unusual for her. She had a normal bowel movement last night. She tried 800 mg ibuprofen and rest with improvement in her symptoms. There are no aggravating factors. Her symptoms are not associated with urination, defecation, eating, drinking, fasting, movement. No fevers above 100.4, nausea, melena, hematochezia, distention. No dysuria, urgency, frequency, cloudy or odorous urine, hematuria. No vaginal odor, genital rash. She is in a long-term monogamous relationship with a female who is asymptomatic, STDs are not a concern today. States that the car ride over here was nonpainful. She stated this feels identical to previous episode of diverticulitis for which she required resection. She has a past medical history of GERD, anxiety, depression. No history of PID, ectopic pregnancies, atrial fibrillation, OCP use, hypercoagulability, UTI, pyelonephritis, nephrolithiasis. No history of gonorrhea, chlamydia, herpes, HIV, syphilis, Trichomonas, BV, yeast, ovarian cyst. No history of diabetes, hypertension, mesenteric ischemia, pancreatitis, alcohol abuse, gallbladder disease. LMP: 2 weeks ago. She has an IUD. LFY:[id number], Titus Mould, DO    Past Medical History:  Diagnosis Date  . Anxiety   . Depression   . GERD (gastroesophageal reflux disease)     Past Surgical History:  Procedure Laterality Date  . ABDOMINAL SURGERY    . FOOT SURGERY Bilateral     History reviewed. No pertinent family history.  Social History  Substance Use Topics  . Smoking status: Current Every Day Smoker    Packs/day: 0.50    Types: Cigarettes   . Smokeless tobacco: Never Used  . Alcohol use 0.0 oz/week     Comment: occasionally    No current facility-administered medications for this encounter.   Current Outpatient Prescriptions:  .  ALPRAZolam (XANAX) 0.5 MG tablet, Take 0.5 mg by mouth at bedtime as needed for anxiety., Disp: , Rfl:  .  amitriptyline (ELAVIL) 10 MG tablet, Take 10 mg by mouth at bedtime., Disp: , Rfl:  .  omeprazole (PRILOSEC) 20 MG capsule, Take 20 mg by mouth daily., Disp: , Rfl:  .  ondansetron (ZOFRAN ODT) 4 MG disintegrating tablet, Take 1 tablet (4 mg total) by mouth every 8 (eight) hours as needed for nausea or vomiting., Disp: 15 tablet, Rfl: 0 .  PARoxetine (PAXIL) 20 MG tablet, TAKE ONE TABLET BY MOUTH ONCE DAILY, Disp: , Rfl:  .  ciprofloxacin (CIPRO) 500 MG tablet, Take 1 tablet (500 mg total) by mouth 2 (two) times daily., Disp: 14 tablet, Rfl: 0 .  fluconazole (DIFLUCAN) 150 MG tablet, Take 1 tablet (150 mg total) by mouth once. 1 tab po x 1. May repeat in 72 hours if no improvement, Disp: 2 tablet, Rfl: 1 .  ibuprofen (ADVIL,MOTRIN) 600 MG tablet, Take 1 tablet (600 mg total) by mouth every 6 (six) hours as needed., Disp: 30 tablet, Rfl: 0 .  metroNIDAZOLE (FLAGYL) 500 MG tablet, Take 1 tablet (500 mg total) by mouth 2 (two) times daily., Disp: 14 tablet, Rfl: 0 .  oseltamivir (TAMIFLU) 75 MG capsule, Take 1 capsule (75 mg total) by mouth every 12 (twelve) hours., Disp: 10 capsule, Rfl: 0  No Known Allergies   ROS  As noted in HPI.  Physical Exam  BP 123/67 (BP Location: Left Arm)   Pulse 80   Temp 98.6 F (37 C) (Oral)   Resp 18   Ht 5\' 8"  (1.727 m)   Wt 190 lb (86.2 kg)   LMP 02/12/2017   SpO2 98%   BMI 28.89 kg/m   Constitutional: Well developed, well nourished, no acute distress moving around comfortably Eyes:  EOMI, conjunctiva normal bilaterally HENT: Normocephalic, atraumatic,mucus membranes moist Respiratory: Normal inspiratory effort lungs clear  bilaterally Cardiovascular: Normal rate regular rhythm no murmurs rubs or gallops GI: nondistended positive healed vertical scar along the umbilicus, soft, diffuse tenderness over the lower quadrants and suprapubic region, positive rebound left lower quadrant. No flank tenderness. Negative Murphy. Negative Rovsing. Negative tap Table test. Back: No CVAT GU: Normal external genitalia, IUD strings seen at the os. Mild right red blood in the vaginal vault. Normal os. Uterus smooth, nontender. No adnexal tenderness or masses. No CMT. Chaperone present during exam skin: No rash, skin intact Musculoskeletal: no deformities Neurologic: Alert & oriented x 3, no focal neuro deficits Psychiatric: Speech and behavior appropriate   ED Course   Medications - No data to display  Orders Placed This Encounter  Procedures  . [place] rt PCR    Standing Status:   Standing    Number of Occurrences:   1    Order Specific Question:   Patient immune status    Answer:   Normal  . Wet prep, genital    Standing Status:   Standing    Number of Occurrences:   1    Order Specific Question:   Patient immune status    Answer:   Normal  . Urinalysis, Complete w Microscopic    Standing Status:   Standing    Number of Occurrences:   1  . Lipase, blood    Standing Status:   Standing    Number of Occurrences:   1  . Comprehensive metabolic panel    Standing Status:   Standing    Number of Occurrences:   1  . CBC with Differential    Standing Status:   Standing    Number of Occurrences:   1  . Pregnancy, urine    Standing Status:   Standing    Number of Occurrences:   1    Results for orders placed or performed during the hospital encounter of 02/26/17 (from the past 24 hour(s))  Urinalysis, Complete w Microscopic     Status: Abnormal   Collection Time: 02/26/17  8:07 AM  Result Value Ref Range   Color, Urine YELLOW YELLOW   APPearance CLEAR CLEAR   Specific Gravity, Urine 1.015 1.005 - 1.030    pH 7.5 5.0 - 8.0   Glucose, UA NEGATIVE NEGATIVE mg/dL   Hgb urine dipstick LARGE (A) NEGATIVE   Bilirubin Urine NEGATIVE NEGATIVE   Ketones, ur NEGATIVE NEGATIVE mg/dL   Protein, ur NEGATIVE NEGATIVE mg/dL   Nitrite NEGATIVE NEGATIVE   Leukocytes, UA NEGATIVE NEGATIVE   Squamous Epithelial / LPF 0-5 (A) NONE SEEN   WBC, UA 0-5 0 - 5 WBC/hpf   RBC / HPF 6-30 0 - 5 RBC/hpf   Bacteria, UA RARE (A) NONE SEEN  Pregnancy, urine     Status: None   Collection Time: 02/26/17  8:07 AM  Result Value Ref Range   Preg Test, Ur NEGATIVE NEGATIVE  Wet prep, genital     Status: Abnormal   Collection Time: 02/26/17  8:42 AM  Result Value Ref Range   Yeast Wet Prep HPF POC PRESENT (A) NONE SEEN   Trich, Wet Prep NONE SEEN NONE SEEN   Clue Cells Wet Prep HPF POC PRESENT (A) NONE SEEN   WBC, Wet Prep HPF POC MODERATE (A) NONE SEEN   Sperm NONE SEEN   Lipase, blood     Status: None   Collection Time: 02/26/17  8:42 AM  Result Value Ref Range   Lipase 31 11 - 51 U/L  Comprehensive metabolic panel     Status: Abnormal   Collection Time: 02/26/17  8:42 AM  Result Value Ref Range   Sodium 135 135 - 145 mmol/L   Potassium 4.3 3.5 - 5.1 mmol/L   Chloride 103 101 - 111 mmol/L   CO2 24 22 - 32 mmol/L   Glucose, Bld 110 (H) 65 - 99 mg/dL   BUN 9 6 - 20 mg/dL   Creatinine, Ser 0.71 0.44 - 1.00 mg/dL   Calcium 8.8 (L) 8.9 - 10.3 mg/dL   Total Protein 7.9 6.5 - 8.1 g/dL   Albumin 4.0 3.5 - 5.0 g/dL   AST 49 (H) 15 - 41 U/L   ALT 50 14 - 54 U/L   Alkaline Phosphatase 72 38 - 126 U/L   Total Bilirubin 0.7 0.3 - 1.2 mg/dL   GFR calc non Af Amer >60 >60 mL/min   GFR calc Af Amer >60 >60 mL/min   Anion gap 8 5 - 15  CBC with Differential     Status: Abnormal   Collection Time: 02/26/17  8:42 AM  Result Value Ref Range   WBC 7.5 3.6 - 11.0 K/uL   RBC 4.45 3.80 - 5.20 MIL/uL   Hemoglobin 12.2 12.0 - 16.0 g/dL   HCT 37.0 35.0 - 47.0 %   MCV 83.1 80.0 - 100.0 fL   MCH 27.4 26.0 - 34.0 pg   MCHC 32.9  32.0 - 36.0 g/dL   RDW 15.9 (H) 11.5 - 14.5 %   Platelets 259 150 - 440 K/uL   Neutrophils Relative % 63 %   Neutro Abs 4.7 1.4 - 6.5 K/uL   Lymphocytes Relative 27 %   Lymphs Abs 2.0 1.0 - 3.6 K/uL   Monocytes Relative 6 %   Monocytes Absolute 0.4 0.2 - 0.9 K/uL   Eosinophils Relative 3 %   Eosinophils Absolute 0.2 0 - 0.7 K/uL   Basophils Relative 1 %   Basophils Absolute 0.1 0 - 0.1 K/uL   No results found.  ED Clinical Impression  BV (bacterial vaginosis)  Yeast vaginitis  Left lower quadrant pain   ED Assessment/Plan  Declined HIV, RPR testing. Feel this is reasonable given that she is in a long-term monogamous relationship. Feel that this is more intestinal rather than gynecologic cause such as torsion, ovarian cyst, PID. Checking CBC, CMP, lipase, gonorrhea, chlamydia, UA, urine pregnancy. We'll reevaluate.  UA with hematuria, but she does have vaginal bleeding. She does not have any evidence of UTI. Doubt nephrolithiasis.  CMP, CBC lipase normal. She is not pregnant. She has BV and yeast. She does not have Trichomonas. Gonorrhea and chlamydia pending. Deferring empiric treatment today. No evidence of PID.  We will send home with Flagyl  500 mg by mouth twice a day for 7 days which will treat the BV and Diflucan also for the yeast, Cipro 500 mg twice a day for 7 days to treat a presumed diverticulitis. she is to go to the ER in 24 hours  if she is not better, sooner if she gets worse for CT scan.  Discussed labs, imaging, MDM, plan and followup with patient. Discussed sn/sx that should prompt return to the ED. Patient agrees with plan.   Meds ordered this encounter  Medications  . fluconazole (DIFLUCAN) 150 MG tablet    Sig: Take 1 tablet (150 mg total) by mouth once. 1 tab po x 1. May repeat in 72 hours if no improvement    Dispense:  2 tablet    Refill:  1  . metroNIDAZOLE (FLAGYL) 500 MG tablet    Sig: Take 1 tablet (500 mg total) by mouth 2 (two) times daily.     Dispense:  14 tablet    Refill:  0  . ciprofloxacin (CIPRO) 500 MG tablet    Sig: Take 1 tablet (500 mg total) by mouth 2 (two) times daily.    Dispense:  14 tablet    Refill:  0  . ibuprofen (ADVIL,MOTRIN) 600 MG tablet    Sig: Take 1 tablet (600 mg total) by mouth every 6 (six) hours as needed.    Dispense:  30 tablet    Refill:  0    *This clinic note was created using [employer]. Therefore, there may be occasional mistakes despite careful proofreading.  ?   Melynda Ripple, MD 02/26/17 1004

## 2017-02-26 NOTE — Discharge Instructions (Signed)
Take the Flagyl. This will treat the BV. Also treated diverticulitis, which she may have. Finish the Flagyl, this will also treat a diverticulitis. Go to the ER if you're not better in 24 hours, sooner if you get worse. Also take the Diflucan, and this will treat your yeast infection  Also take the ibuprofen 600 mg 1 g of Tylenol 3-4 times a day. This is a very effective combination for pain. This provides level pain relief equivalent to opiates.

## 2017-05-02 IMAGING — CR DG THORACIC SPINE 2V
3 series · 3 of 3 positions shown · non-contrast
Comparison: None.

CLINICAL DATA: Mid thoracic pain for 1 day

EXAM:
THORACIC SPINE 2 VIEWS

[t-spine ap]
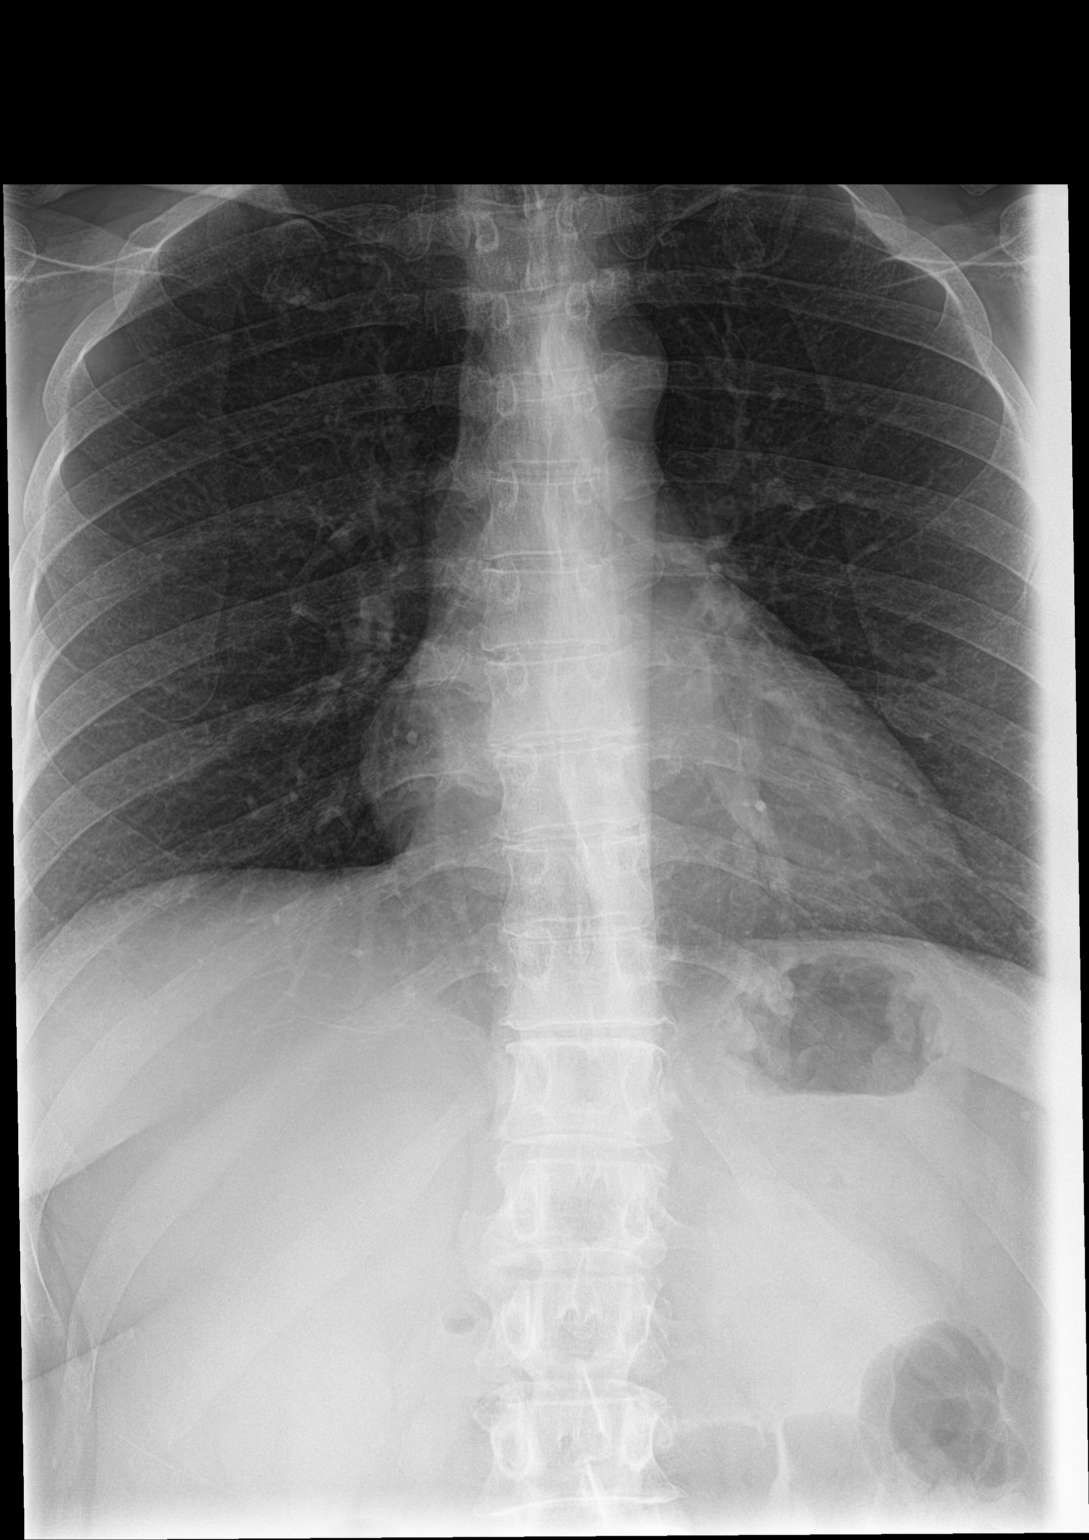

[t-spine swimmers]
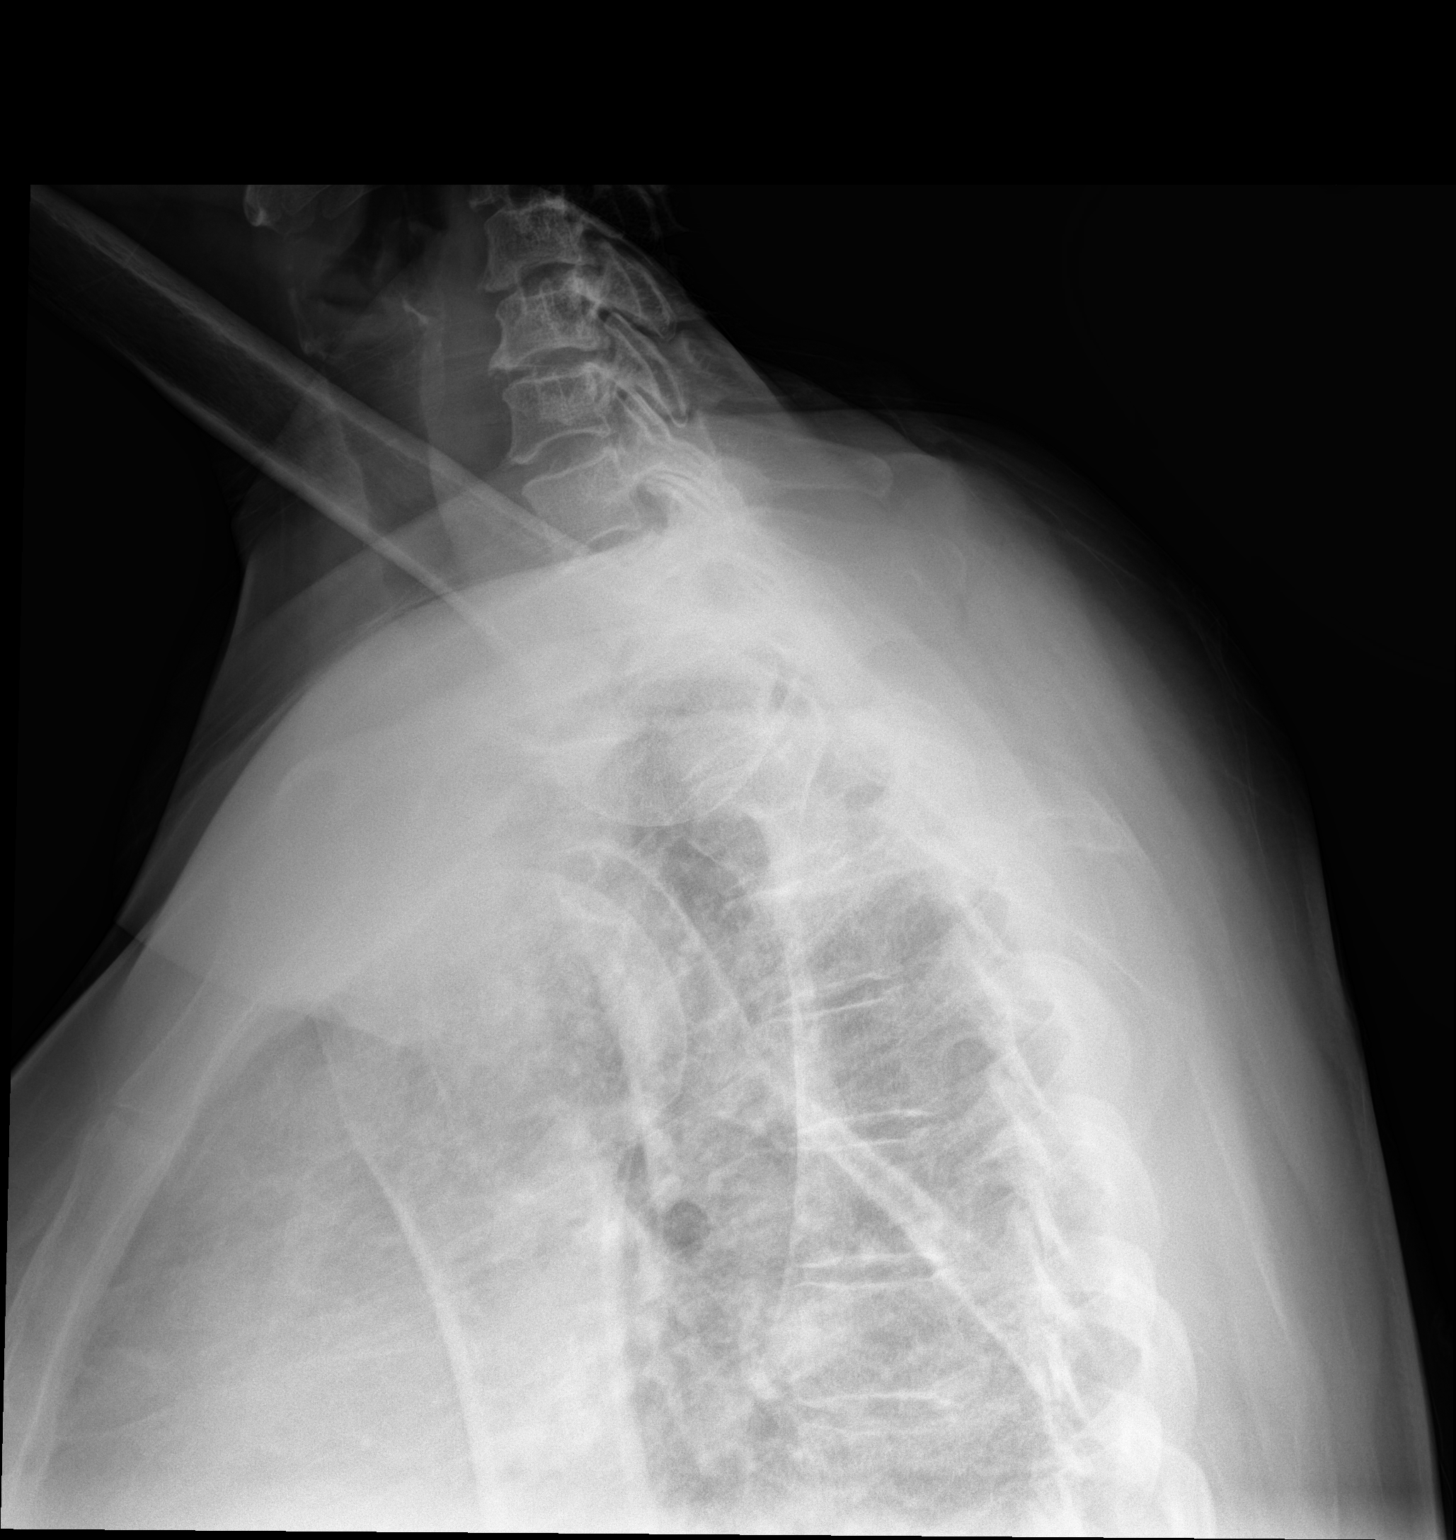

[t-spine lat]
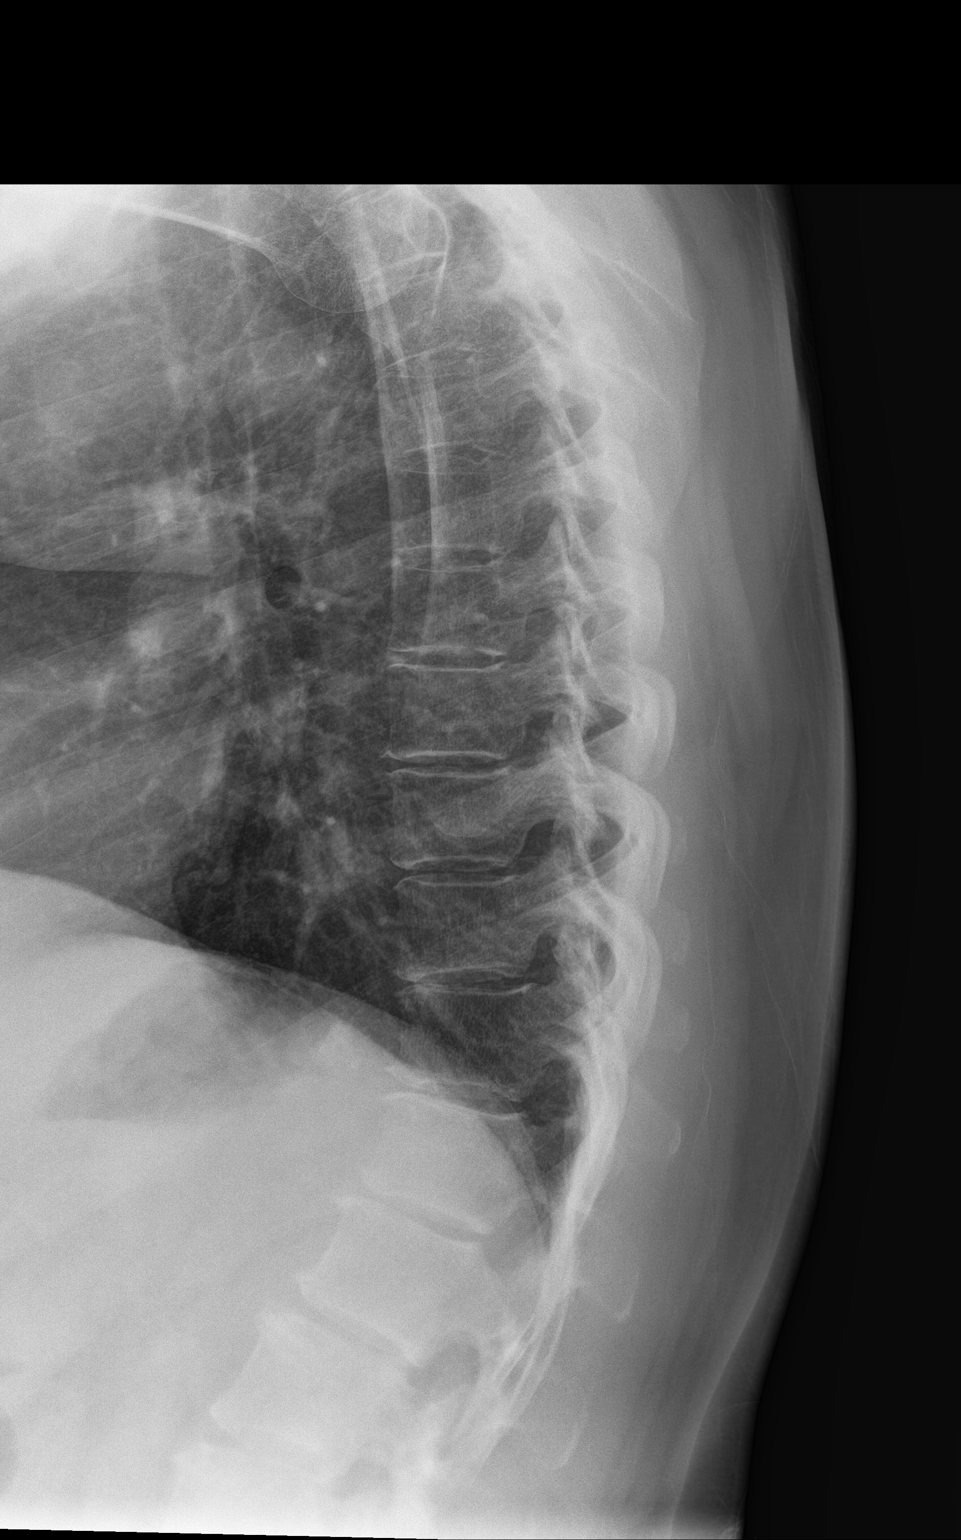

[3 of 3 positions shown; findings below may reference images not displayed]

FINDINGS: Vertebral body heights are grossly maintained. There are minimal
degenerative changes.
IMPRESSION: Mild degenerative changes.  No acute osseous abnormality.

## 2018-04-20 ENCOUNTER — Other Ambulatory Visit: Payer: Self-pay

## 2018-04-20 ENCOUNTER — Ambulatory Visit
Admission: EM | Admit: 2018-04-20 | Discharge: 2018-04-20 | Disposition: A | Discharge status: Home / Self Care | Payer: Self-pay | Attending: Internal Medicine | Admitting: Internal Medicine

## 2018-04-20 DIAGNOSIS — M5126 Other intervertebral disc displacement, lumbar region: Secondary | ICD-10-CM

## 2018-04-20 LAB — URINALYSIS, COMPLETE (UACMP) WITH MICROSCOPIC
Bacteria, UA: NONE SEEN
Bilirubin Urine: NEGATIVE
Glucose, UA: 100 mg/dL — AB
Hgb urine dipstick: NEGATIVE
Ketones, ur: NEGATIVE mg/dL
Nitrite: NEGATIVE
Protein, ur: NEGATIVE mg/dL
RBC / HPF: NONE SEEN RBC/hpf (ref 0–5)
Specific Gravity, Urine: 1.01 (ref 1.005–1.030)
pH: 6 (ref 5.0–8.0)

## 2018-04-20 MED ORDER — PREDNISONE 10 MG PO TABS: 50.0000 mg | ORAL_TABLET | Freq: Every day | ORAL | 0 refills | Status: AC

## 2018-04-20 MED ORDER — OXYCODONE-ACETAMINOPHEN 5-325 MG PO TABS: 2.0000 | ORAL_TABLET | ORAL | 0 refills | Status: DC | PRN

## 2018-04-20 MED ORDER — METAXALONE 800 MG PO TABS: 800.0000 mg | ORAL_TABLET | Freq: Three times a day (TID) | ORAL | 0 refills | Status: DC

## 2018-04-20 NOTE — ED Triage Notes (Signed)
Patient complains of low back pain that started over 1 week ago. Patient denies injury to the area. Pain mostly on right lower side.

## 2018-04-20 NOTE — ED Provider Notes (Signed)
[facility]    CSN: [id number] Arrival date & time: 04/20/18  0802     History   Chief Complaint Chief Complaint  Patient presents with  . Back Pain    HPI Theresa Sanders is a 52 y.o. female.   Patient with right side low back pain. No trauma or heavy lifting. Cannot recall a moment when pain began but has been hurting x1 week. Denies loss of b/b continence. No shooting pain but dull ache radiates into right thigh     Past Medical History:  Diagnosis Date  . Anxiety   . Depression   . GERD (gastroesophageal reflux disease)     Patient Active Problem List   Diagnosis Date Noted  . Anxiety 01/03/2015  . Clinical depression 01/03/2015  . Diverticulitis 01/03/2015  . Gout 01/03/2015  . HPV test positive 01/03/2015  . HLD (hyperlipidemia) 01/03/2015  . Avitaminosis D 01/03/2015    Past Surgical History:  Procedure Laterality Date  . ABDOMINAL SURGERY    . FOOT SURGERY Bilateral     OB History   None      Home Medications    Prior to Admission medications   Medication Sig Start Date End Date Taking? Authorizing Provider  ALPRAZolam Duanne Moron) 0.5 MG tablet Take 0.5 mg by mouth at bedtime as needed for anxiety.   Yes [provider]  amitriptyline (ELAVIL) 10 MG tablet Take 10 mg by mouth at bedtime.   Yes [provider]  ibuprofen (ADVIL,MOTRIN) 600 MG tablet Take 1 tablet (600 mg total) by mouth every 6 (six) hours as needed. 02/26/17  Yes Melynda Ripple, MD  omeprazole (PRILOSEC) 20 MG capsule Take 20 mg by mouth daily.   Yes [provider]  PARoxetine (PAXIL) 20 MG tablet TAKE ONE TABLET BY MOUTH ONCE DAILY 12/31/14  Yes [provider]  ciprofloxacin (CIPRO) 500 MG tablet Take 1 tablet (500 mg total) by mouth 2 (two) times daily. 02/26/17   Melynda Ripple, MD  metaxalone (SKELAXIN) 800 MG tablet Take 1 tablet (800 mg total) by mouth 3 (three) times daily. 04/20/18   Harrie Foreman, MD  ondansetron  (ZOFRAN ODT) 4 MG disintegrating tablet Take 1 tablet (4 mg total) by mouth every 8 (eight) hours as needed for nausea or vomiting. 08/30/16   Marylene Land, NP  oseltamivir (TAMIFLU) 75 MG capsule Take 1 capsule (75 mg total) by mouth every 12 (twelve) hours. 10/10/16   Marylene Land, NP  oxyCODONE-acetaminophen (PERCOCET/ROXICET) 5-325 MG tablet Take 2 tablets by mouth every 4 (four) hours as needed for severe pain. 04/20/18   Harrie Foreman, MD  predniSONE (DELTASONE) 10 MG tablet Take 5 tablets (50 mg total) by mouth daily for 3 days. 04/20/18 04/23/18  Harrie Foreman, MD    Family History History reviewed. No pertinent family history.  Social History Social History   Tobacco Use  . Smoking status: Current Every Day Smoker    Packs/day: 0.50    Types: Cigarettes  . Smokeless tobacco: Never Used  Substance Use Topics  . Alcohol use: Yes    Alcohol/week: 0.0 standard drinks    Comment: occasionally  . Drug use: No     Allergies   Patient has no known allergies.   Review of Systems Review of Systems  Constitutional: Negative for chills and fever.  HENT: Negative for sore throat and tinnitus.   Eyes: Negative for redness.  Respiratory: Negative for cough and shortness of breath.   Cardiovascular: Negative  for chest pain and palpitations.  Gastrointestinal: Negative for abdominal pain, diarrhea, nausea and vomiting.  Genitourinary: Negative for dysuria, frequency and urgency.  Musculoskeletal: Positive for back pain. Negative for myalgias.  Skin: Negative for rash.       No lesions  Neurological: Negative for weakness.  Hematological: Does not bruise/bleed easily.  Psychiatric/Behavioral: Negative for suicidal ideas.     Physical Exam Triage Vital Signs ED Triage Vitals  Enc Vitals Group     BP 04/20/18 0815 (!) 141/75     Pulse Rate 04/20/18 0815 66     Resp 04/20/18 0815 18     Temp 04/20/18 0815 98.5 F (36.9 C)     Temp Source 04/20/18 0815 Oral      SpO2 04/20/18 0815 100 %     Weight 04/20/18 0813 200 lb (90.7 kg)     Height 04/20/18 0813 5' 7.5" (1.715 m)     Head Circumference --      Peak Flow --      Pain Score 04/20/18 0813 8     Pain Loc --      Pain Edu? --      Excl. in [place]? --    No data found.  Updated Vital Signs BP (!) 141/75 (BP Location: Left Arm)   Pulse 66   Temp 98.5 F (36.9 C) (Oral)   Resp 18   Ht 5' 7.5" (1.715 m)   Wt 90.7 kg   SpO2 100%   BMI 30.86 kg/m   Visual Acuity Right Eye Distance:   Left Eye Distance:   Bilateral Distance:    Right Eye Near:   Left Eye Near:    Bilateral Near:     Physical Exam  Constitutional: She is oriented to person, place, and time. She appears well-developed and well-nourished. No distress.  HENT:  Head: Normocephalic and atraumatic.  Mouth/Throat: Oropharynx is clear and moist.  Eyes: Pupils are equal, round, and reactive to light. Conjunctivae and EOM are normal. No scleral icterus.  Neck: Normal range of motion. Neck supple. No JVD present. No tracheal deviation present. No thyromegaly present.  Cardiovascular: Normal rate, regular rhythm and normal heart sounds. Exam reveals no gallop and no friction rub.  No murmur heard. Pulmonary/Chest: Effort normal and breath sounds normal.  Abdominal: Soft. Bowel sounds are normal. She exhibits no distension. There is no tenderness.  Musculoskeletal: Normal range of motion. She exhibits tenderness (right paraspinal lumbar area). She exhibits no edema.  Lymphadenopathy:    She has no cervical adenopathy.  Neurological: She is alert and oriented to person, place, and time. No cranial nerve deficit.  Skin: Skin is warm and dry.  Psychiatric: She has a normal mood and affect. Her behavior is normal. Judgment and thought content normal.  Nursing note and vitals reviewed.    UC Treatments / Results  Labs (all labs ordered are listed, but only abnormal results are displayed) Labs Reviewed  URINALYSIS, COMPLETE  (UACMP) WITH MICROSCOPIC    EKG None  Radiology No results found.  Procedures Procedures (including critical care time)  Medications Ordered in UC Medications - No data to display  Initial Impression / Assessment and Plan / UC Course  I have reviewed the triage vital signs and the nursing notes.  Pertinent labs & imaging results that were available during my care of the patient were reviewed by me and considered in my medical decision making (see chart for details).     No red flag symptoms but  concern for disc herniation. Patellar tendon reflexes normal b/l.   Final Clinical Impressions(s) / UC Diagnoses   Final diagnoses:  Prolapsed lumbar disc   Discharge Instructions   None    ED Prescriptions    Medication Sig Dispense Auth. Provider   metaxalone (SKELAXIN) 800 MG tablet Take 1 tablet (800 mg total) by mouth 3 (three) times daily. 21 tablet Harrie Foreman, MD   predniSONE (DELTASONE) 10 MG tablet Take 5 tablets (50 mg total) by mouth daily for 3 days. 15 tablet Harrie Foreman, MD   oxyCODONE-acetaminophen (PERCOCET/ROXICET) 5-325 MG tablet Take 2 tablets by mouth every 4 (four) hours as needed for severe pain. 15 tablet Harrie Foreman, MD     Controlled Substance Prescriptions [place] Controlled Substance Registry consulted? Yes, I have consulted the [place] Controlled Substances Registry for this patient, and feel the risk/benefit ratio today is favorable for proceeding with this prescription for a controlled substance.   Harrie Foreman, MD 04/20/18 [phone]

## 2018-05-27 ENCOUNTER — Ambulatory Visit (INDEPENDENT_AMBULATORY_CARE_PROVIDER_SITE_OTHER): Payer: Self-pay

## 2018-05-27 ENCOUNTER — Encounter: Payer: Self-pay | Admitting: Emergency Medicine

## 2018-05-27 ENCOUNTER — Ambulatory Visit
Admission: EM | Admit: 2018-05-27 | Discharge: 2018-05-27 | Disposition: A | Discharge status: Nursing Home (Includes ICF) | Payer: Self-pay | Attending: Family Medicine | Admitting: Family Medicine

## 2018-05-27 ENCOUNTER — Other Ambulatory Visit: Payer: Self-pay

## 2018-05-27 DIAGNOSIS — Z79899 Other long term (current) drug therapy: Secondary | ICD-10-CM | POA: Insufficient documentation

## 2018-05-27 DIAGNOSIS — R079 Chest pain, unspecified: Secondary | ICD-10-CM | POA: Insufficient documentation

## 2018-05-27 DIAGNOSIS — K219 Gastro-esophageal reflux disease without esophagitis: Secondary | ICD-10-CM | POA: Insufficient documentation

## 2018-05-27 DIAGNOSIS — F17210 Nicotine dependence, cigarettes, uncomplicated: Secondary | ICD-10-CM | POA: Insufficient documentation

## 2018-05-27 DIAGNOSIS — F329 Major depressive disorder, single episode, unspecified: Secondary | ICD-10-CM | POA: Insufficient documentation

## 2018-05-27 DIAGNOSIS — E785 Hyperlipidemia, unspecified: Secondary | ICD-10-CM | POA: Insufficient documentation

## 2018-05-27 DIAGNOSIS — R0602 Shortness of breath: Secondary | ICD-10-CM

## 2018-05-27 DIAGNOSIS — F17200 Nicotine dependence, unspecified, uncomplicated: Secondary | ICD-10-CM

## 2018-05-27 DIAGNOSIS — F419 Anxiety disorder, unspecified: Secondary | ICD-10-CM | POA: Insufficient documentation

## 2018-05-27 DIAGNOSIS — M109 Gout, unspecified: Secondary | ICD-10-CM | POA: Insufficient documentation

## 2018-05-27 DIAGNOSIS — E559 Vitamin D deficiency, unspecified: Secondary | ICD-10-CM | POA: Insufficient documentation

## 2018-05-27 DIAGNOSIS — R05 Cough: Secondary | ICD-10-CM

## 2018-05-27 MED ORDER — ACETAMINOPHEN 500 MG PO TABS
1000.0000 mg | ORAL_TABLET | Freq: Once | ORAL | Status: AC
  Administered 2018-05-27: 1000 mg via ORAL

## 2018-05-27 NOTE — ED Provider Notes (Addendum)
[facility]    CSN: [id number] Arrival date & time: 05/27/18  1353     History   Chief Complaint Chief Complaint  Patient presents with  . Chest Pain    HPI Theresa Sanders is a 52 y.o. female.   51 yo female with a c/o sudden onset of shortness of breath yesterday and a mild cough. States shortness of breath is worse with walking or other minimal exertion. Denies chest pains, nausea, arm pain, neck pain, leg swelling, wheezing. States mild cough is not productive. States 2 weeks ago she had flu like symptoms.   The history is provided by the patient.    Past Medical History:  Diagnosis Date  . Anxiety   . Depression   . GERD (gastroesophageal reflux disease)     Patient Active Problem List   Diagnosis Date Noted  . Anxiety 01/03/2015  . Clinical depression 01/03/2015  . Diverticulitis 01/03/2015  . Gout 01/03/2015  . HPV test positive 01/03/2015  . HLD (hyperlipidemia) 01/03/2015  . Avitaminosis D 01/03/2015    Past Surgical History:  Procedure Laterality Date  . ABDOMINAL SURGERY    . FOOT SURGERY Bilateral     OB History   None      Home Medications    Prior to Admission medications   Medication Sig Start Date End Date Taking? Authorizing Provider  ALPRAZolam Duanne Moron) 0.5 MG tablet Take 0.5 mg by mouth at bedtime as needed for anxiety.   Yes [provider]  omeprazole (PRILOSEC) 20 MG capsule Take 20 mg by mouth daily.   Yes [provider]  PARoxetine (PAXIL) 20 MG tablet TAKE ONE TABLET BY MOUTH ONCE DAILY 12/31/14  Yes [provider]  amitriptyline (ELAVIL) 10 MG tablet Take 10 mg by mouth at bedtime.    [provider]  ciprofloxacin (CIPRO) 500 MG tablet Take 1 tablet (500 mg total) by mouth 2 (two) times daily. 02/26/17   Melynda Ripple, MD  ibuprofen (ADVIL,MOTRIN) 600 MG tablet Take 1 tablet (600 mg total) by mouth every 6 (six) hours as needed. 02/26/17   Melynda Ripple, MD  metaxalone  (SKELAXIN) 800 MG tablet Take 1 tablet (800 mg total) by mouth 3 (three) times daily. 04/20/18   Harrie Foreman, MD  ondansetron (ZOFRAN ODT) 4 MG disintegrating tablet Take 1 tablet (4 mg total) by mouth every 8 (eight) hours as needed for nausea or vomiting. 08/30/16   Marylene Land, NP  oseltamivir (TAMIFLU) 75 MG capsule Take 1 capsule (75 mg total) by mouth every 12 (twelve) hours. 10/10/16   Marylene Land, NP  oxyCODONE-acetaminophen (PERCOCET/ROXICET) 5-325 MG tablet Take 2 tablets by mouth every 4 (four) hours as needed for severe pain. 04/20/18   Harrie Foreman, MD    Family History No family history on file.  Social History Social History   Tobacco Use  . Smoking status: Current Every Day Smoker    Packs/day: 0.50    Types: Cigarettes  . Smokeless tobacco: Never Used  Substance Use Topics  . Alcohol use: Yes    Alcohol/week: 0.0 standard drinks    Comment: occasionally  . Drug use: No     Allergies   Patient has no known allergies.   Review of Systems Review of Systems   Physical Exam Triage Vital Signs ED Triage Vitals [05/27/18 1403]  Enc Vitals Group     BP (!) 184/79     Pulse Rate 81     Resp 18  Temp 100.1 F (37.8 C)     Temp Source Oral     SpO2 97 %     Weight 200 lb (90.7 kg)     Height 5\' 8"  (1.727 m)     Head Circumference      Peak Flow      Pain Score 7     Pain Loc      Pain Edu?      Excl. in [place]?    No data found.  Updated Vital Signs BP (!) 184/79 (BP Location: Right Arm)   Pulse 81   Temp 100.1 F (37.8 C) (Oral)   Resp 18   Ht 5\' 8"  (1.727 m)   Wt 90.7 kg   SpO2 97%   BMI 30.41 kg/m   O2 sat with ambulation: 92%     Visual Acuity Right Eye Distance:   Left Eye Distance:   Bilateral Distance:    Right Eye Near:   Left Eye Near:    Bilateral Near:     Physical Exam  Constitutional: She appears well-developed and well-nourished. No distress.  HENT:  Head: Normocephalic and atraumatic.  Right Ear:  Tympanic membrane and ear canal normal.  Left Ear: Tympanic membrane and ear canal normal.  Nose: No mucosal edema, rhinorrhea, nose lacerations, sinus tenderness, nasal deformity, septal deviation or nasal septal hematoma. No epistaxis.  No foreign bodies. Right sinus exhibits no maxillary sinus tenderness and no frontal sinus tenderness. Left sinus exhibits no maxillary sinus tenderness and no frontal sinus tenderness.  Mouth/Throat: Uvula is midline, oropharynx is clear and moist and mucous membranes are normal. No oropharyngeal exudate, posterior oropharyngeal edema, posterior oropharyngeal erythema or tonsillar abscesses. No tonsillar exudate.  Neck: Normal range of motion. Neck supple. No thyromegaly present.  Cardiovascular: Normal rate, regular rhythm, normal heart sounds and intact distal pulses.  Pulmonary/Chest: Effort normal and breath sounds normal. No stridor. No respiratory distress. She has no wheezes. She has no rales.  Lymphadenopathy:    She has no cervical adenopathy.  Skin: She is not diaphoretic.  Nursing note and vitals reviewed.    UC Treatments / Results  Labs (all labs ordered are listed, but only abnormal results are displayed) Labs Reviewed - No data to display  EKG None  Radiology Dg Chest 2 View  Result Date: 05/27/2018 CLINICAL DATA:  Chest pain, shortness of breath, URI symptoms. Current smoker. EXAM: CHEST - 2 VIEW COMPARISON:  Thoracic spine series of July 13, 2016 FINDINGS: The lungs are well-expanded with mild hemidiaphragm flattening. There is no focal infiltrate and no pleural effusion. The heart and pulmonary vascularity are normal. The mediastinum is normal in width. The bony thorax exhibits no acute abnormality. IMPRESSION: Mild hyperinflation consistent with the smoking history and COPD. No acute cardiopulmonary abnormality. Electronically Signed   By: David  Martinique M.D.   On: 05/27/2018 14:23    Procedures ED EKG Date/Time: 05/27/2018 3:23  PM Performed by: Norval Gable, MD Authorized by: Norval Gable, MD   ECG reviewed by ED Physician in the absence of a cardiologist: yes   Previous ECG:    Previous ECG:  Unavailable Interpretation:    Interpretation: normal   Rate:    ECG rate:  74   ECG rate assessment: normal   Rhythm:    Rhythm: sinus rhythm   Ectopy:    Ectopy: none   QRS:    QRS axis:  Normal   QRS intervals:  Normal Conduction:    Conduction: normal  ST segments:    ST segments:  Normal T waves:    T waves: normal     (including critical care time)  Medications Ordered in UC Medications  acetaminophen (TYLENOL) tablet 1,000 mg (1,000 mg Oral Given 05/27/18 1442)    Initial Impression / Assessment and Plan / UC Course  I have reviewed the triage vital signs and the nursing notes.  Pertinent labs & imaging results that were available during my care of the patient were reviewed by me and considered in my medical decision making (see chart for details).      Final Clinical Impressions(s) / UC Diagnoses   Final diagnoses:  Shortness of breath  (unknown etiology)   Discharge Instructions     Recommend patient go to Emergency Department for further evaluation and management    ED Prescriptions    None     1. ekg/x-ray results and possible diagnosis reviewed with patient; recommend patient go to Emergency Department for further evaluation and management  Controlled Substance Prescriptions [place] Controlled Substance Registry consulted? Not Applicable   Norval Gable, MD 05/27/18 [place], MD 05/27/18 [phone]

## 2018-05-27 NOTE — Discharge Instructions (Signed)
Recommend patient go to Emergency Department for further evaluation and management °

## 2018-05-27 NOTE — ED Triage Notes (Signed)
Patient c/o fever, shortness of breath, cough and chest tightness that started this morning.

## 2019-07-23 ENCOUNTER — Other Ambulatory Visit: Payer: Self-pay

## 2019-07-23 ENCOUNTER — Encounter: Payer: Self-pay | Admitting: Emergency Medicine

## 2019-07-23 ENCOUNTER — Ambulatory Visit (INDEPENDENT_AMBULATORY_CARE_PROVIDER_SITE_OTHER): Payer: Self-pay

## 2019-07-23 ENCOUNTER — Ambulatory Visit
Admission: EM | Admit: 2019-07-23 | Discharge: 2019-07-23 | Disposition: A | Discharge status: Home / Self Care | Payer: Self-pay | Attending: Family Medicine | Admitting: Family Medicine

## 2019-07-23 DIAGNOSIS — M25511 Pain in right shoulder: Secondary | ICD-10-CM

## 2019-07-23 DIAGNOSIS — S4991XA Unspecified injury of right shoulder and upper arm, initial encounter: Secondary | ICD-10-CM

## 2019-07-23 DIAGNOSIS — W01198A Fall on same level from slipping, tripping and stumbling with subsequent striking against other object, initial encounter: Secondary | ICD-10-CM

## 2019-07-23 MED ORDER — MELOXICAM 15 MG PO TABS: 15.0000 mg | ORAL_TABLET | Freq: Every day | ORAL | 0 refills | Status: DC | PRN

## 2019-07-23 NOTE — ED Provider Notes (Signed)
[facility]    CSN: [id number] Arrival date & time: 07/23/19  1247  History   Chief Complaint Chief Complaint  Patient presents with  . Shoulder Injury    right DOI 07/07/19   HPI  53 year old female presents with right shoulder pain.  Patient reports that approximately 3 weeks ago she fell in the [place] parking lot.  She fell directly on her right shoulder.  Since that time she has had persistent right shoulder pain as well as decreased range of motion particularly in flexion and abduction.  She rates her pain as 9/10 in severity.  Worse with range of motion/activities.  She is taken ibuprofen without relief.  Has difficulty localizing the exact area of the pain.  No other reported symptoms.  No other complaints or concerns at this time.  PMH, Surgical Hx, Family Hx, Social History reviewed and updated as below.  Past Medical History:  Diagnosis Date  . Anxiety   . Depression   . GERD (gastroesophageal reflux disease)     Patient Active Problem List   Diagnosis Date Noted  . Anxiety 01/03/2015  . Clinical depression 01/03/2015  . Diverticulitis 01/03/2015  . Gout 01/03/2015  . HPV test positive 01/03/2015  . HLD (hyperlipidemia) 01/03/2015  . Avitaminosis D 01/03/2015    Past Surgical History:  Procedure Laterality Date  . ABDOMINAL SURGERY    . FOOT SURGERY Bilateral     OB History   No obstetric history on file.      Home Medications    Prior to Admission medications   Medication Sig Start Date End Date Taking? Authorizing Provider  ALPRAZolam Duanne Moron) 0.5 MG tablet Take 0.5 mg by mouth at bedtime as needed for anxiety.   Yes [provider]  omeprazole (PRILOSEC) 20 MG capsule Take 20 mg by mouth daily.   Yes [provider]  PARAGARD INTRAUTERINE COPPER IU by Intrauterine route.   Yes [provider]  PARoxetine (PAXIL) 20 MG tablet TAKE ONE TABLET BY MOUTH ONCE DAILY 12/31/14  Yes [provider]  meloxicam  (MOBIC) 15 MG tablet Take 1 tablet (15 mg total) by mouth daily as needed for pain. 07/23/19   Coral Spikes, DO  amitriptyline (ELAVIL) 10 MG tablet Take 10 mg by mouth at bedtime.  07/23/19  [provider]    Family History Family History  Problem Relation Age of Onset  . Atrial fibrillation Mother   . Atrial fibrillation Father     Social History Social History   Tobacco Use  . Smoking status: Current Every Day Smoker    Packs/day: 0.50    Years: 25.00    Pack years: 12.50    Types: Cigarettes  . Smokeless tobacco: Never Used  Substance Use Topics  . Alcohol use: Yes    Alcohol/week: 0.0 standard drinks    Comment: occasionally  . Drug use: No     Allergies   Patient has no known allergies.   Review of Systems Review of Systems  Constitutional: Negative.   Musculoskeletal:       Right shoulder pain.   Physical Exam Triage Vital Signs ED Triage Vitals  Enc Vitals Group     BP 07/23/19 1333 (!) 148/59     Pulse Rate 07/23/19 1333 71     Resp 07/23/19 1333 18     Temp 07/23/19 1333 99 F (37.2 C)     Temp Source 07/23/19 1333 Oral     SpO2 07/23/19 1333 100 %  Weight 07/23/19 1334 200 lb (90.7 kg)     Height 07/23/19 1334 5\' 8"  (1.727 m)     Head Circumference --      Peak Flow --      Pain Score 07/23/19 1334 9     Pain Loc --      Pain Edu? --      Excl. in [place]? --    Updated Vital Signs BP (!) 148/59 (BP Location: Left Arm)   Pulse 71   Temp 99 F (37.2 C) (Oral)   Resp 18   Ht 5\' 8"  (1.727 m)   Wt 90.7 kg   LMP 07/16/2019 (Approximate) Comment: IUD  SpO2 100%   BMI 30.41 kg/m   Visual Acuity Right Eye Distance:   Left Eye Distance:   Bilateral Distance:    Right Eye Near:   Left Eye Near:    Bilateral Near:     Physical Exam Vitals signs and nursing note reviewed.  Constitutional:      General: She is not in acute distress.    Appearance: Normal appearance. She is not ill-appearing.  HENT:     Head: Normocephalic  and atraumatic.  Eyes:     General:        Right eye: No discharge.        Left eye: No discharge.     Conjunctiva/sclera: Conjunctivae normal.  Cardiovascular:     Rate and Rhythm: Normal rate and regular rhythm.     Heart sounds: No murmur.  Pulmonary:     Effort: Pulmonary effort is normal.     Breath sounds: Normal breath sounds. No wheezing, rhonchi or rales.  Musculoskeletal:     Comments: Right shoulder: Decreased range of motion and flexion and abduction. Positive empty can.  Positive Hawkins. No discrete tenderness over the [facility] joint. Rotator cuff strength intact.  Neurological:     Mental Status: She is alert.  Psychiatric:        Mood and Affect: Mood normal.        Behavior: Behavior normal.    UC Treatments / Results  Labs (all labs ordered are listed, but only abnormal results are displayed) Labs Reviewed - No data to display  EKG   Radiology Dg Shoulder Right  Result Date: 07/23/2019 CLINICAL DATA:  Superior right shoulder pain after falling 3 weeks ago. EXAM: RIGHT SHOULDER - 2+ VIEW COMPARISON:  None. FINDINGS: No evidence of regional fracture. Transscapular Y-view suggests mild superior offset of the clavicle relative to the acromion consistent with low-grade AC joint injury. IMPRESSION: Suspicion of grade 2 AC joint injury.  No regional fracture. Electronically Signed   By: Nelson Chimes M.D.   On: 07/23/2019 14:03    Procedures Procedures (including critical care time)  Medications Ordered in UC Medications - No data to display  Initial Impression / Assessment and Plan / UC Course  I have reviewed the triage vital signs and the nursing notes.  Pertinent labs & imaging results that were available during my care of the patient were reviewed by me and considered in my medical decision making (see chart for details).    53 year old female presents with right shoulder pain.  X-ray revealed suspected grade 2 AC joint injury.  She has no tenderness over  the [facility] joint.  I am concerned about underlying rotator cuff pathology.  Advised to see orthopedics.  Meloxicam as directed.  Will likely need MRI.  Final Clinical Impressions(s) / UC Diagnoses   Final diagnoses:  Injury of right acromioclavicular joint, initial encounter     Discharge Instructions     Rest.  Medication as directed.  See [facility]o - [facility] or [facility].  Take care  Dr. Lacinda Axon    ED Prescriptions    Medication Sig Dispense Auth. Provider   meloxicam (MOBIC) 15 MG tablet Take 1 tablet (15 mg total) by mouth daily as needed for pain. 30 tablet Coral Spikes, DO     PDMP not reviewed this encounter.   Coral Spikes, DO 07/23/19 1450

## 2019-07-23 NOTE — ED Triage Notes (Signed)
Patient in today after falling in [place] parking lot on 07/07/19. Patient states she fell on her right shoulder. Patient has had pain and decreased ROM since the injury.

## 2019-07-23 NOTE — Discharge Instructions (Signed)
Rest.  Medication as directed.  See Ortho - [facility] or [facility].  Take care  Dr. Lacinda Axon

## 2020-02-25 DIAGNOSIS — Z8619 Personal history of other infectious and parasitic diseases: Secondary | ICD-10-CM | POA: Insufficient documentation

## 2020-04-28 ENCOUNTER — Other Ambulatory Visit: Payer: Self-pay

## 2020-04-28 ENCOUNTER — Ambulatory Visit
Admission: RE | Admit: 2020-04-28 | Discharge: 2020-04-28 | Disposition: A | Discharge status: Home / Self Care | Payer: Self-pay | Source: Ambulatory Visit | Attending: Physician Assistant | Admitting: Physician Assistant

## 2020-04-28 VITALS — BP 142/63 | HR 66 | Temp 98.9°F | Resp 18 | Ht 68.0 in | Wt 200.0 lb

## 2020-04-28 DIAGNOSIS — M25551 Pain in right hip: Secondary | ICD-10-CM

## 2020-04-28 DIAGNOSIS — R202 Paresthesia of skin: Secondary | ICD-10-CM

## 2020-04-28 MED ORDER — DICLOFENAC SODIUM 75 MG PO TBEC: 75.0000 mg | DELAYED_RELEASE_TABLET | Freq: Two times a day (BID) | ORAL | 0 refills | Status: AC

## 2020-04-28 MED ORDER — CYCLOBENZAPRINE HCL 10 MG PO TABS: 10.0000 mg | ORAL_TABLET | Freq: Three times a day (TID) | ORAL | 0 refills | Status: AC | PRN

## 2020-04-28 NOTE — Discharge Instructions (Signed)
-  Her symptoms today are most likely due to musculoskeletal cause such as trochanteric bursitis of the hip, muscle spasms, or other back condition.  Begin anti-inflammatory medications, rest, ice.  If you notice a rash in the area please call back as pain is usually before rash with shingles.

## 2020-04-28 NOTE — ED Triage Notes (Signed)
Pt c/o right upper leg pain. She states it feels like pins and needles in the area. It itches and burns. Started about 3 days ago. Pt is worse when she touches it.

## 2020-04-28 NOTE — ED Provider Notes (Signed)
[facility]    CSN: [id number] Arrival date & time: 04/28/20  1609      History   Chief Complaint Chief Complaint  Patient presents with  . Leg Pain    right    HPI ROSANN GORUM is a 54 y.o. female.   11-year-old female presents for concerns about tingling of her right thigh and pain of the right lateral hip x2 days.  She says the discomfort is hard to explain kind of feels like pins-and-needles toward her groin area.  She says the pain is mild to moderate.  She says this unknown cause has increased her anxiety that she already has.  She denies any known injury.  She denies back pain.  Denies weakness of the lower extremities.  No dysuria or vaginal discharge.  No abdominal pain, nausea, vomiting.  Has tried Motrin without much relief.  She has no other concerns.     Past Medical History:  Diagnosis Date  . Anxiety   . Depression   . GERD (gastroesophageal reflux disease)     Patient Active Problem List   Diagnosis Date Noted  . Anxiety 01/03/2015  . Clinical depression 01/03/2015  . Diverticulitis 01/03/2015  . Gout 01/03/2015  . HPV test positive 01/03/2015  . HLD (hyperlipidemia) 01/03/2015  . Avitaminosis D 01/03/2015    Past Surgical History:  Procedure Laterality Date  . ABDOMINAL SURGERY    . FOOT SURGERY Bilateral     OB History   No obstetric history on file.      Home Medications    Prior to Admission medications   Medication Sig Start Date End Date Taking? Authorizing Provider  ALPRAZolam Duanne Moron) 0.5 MG tablet Take 0.5 mg by mouth at bedtime as needed for anxiety.   Yes [provider]  omeprazole (PRILOSEC) 20 MG capsule Take 20 mg by mouth daily.   Yes [provider]  PARAGARD INTRAUTERINE COPPER IU by Intrauterine route.   Yes [provider]  PARoxetine (PAXIL) 20 MG tablet TAKE ONE TABLET BY MOUTH ONCE DAILY 12/31/14  Yes [provider]  cyclobenzaprine (FLEXERIL) 10 MG tablet Take 1 tablet  (10 mg total) by mouth 3 (three) times daily as needed for up to 10 days for muscle spasms. 04/28/20 05/08/20  Danton Clap, PA-C  diclofenac (VOLTAREN) 75 MG EC tablet Take 1 tablet (75 mg total) by mouth 2 (two) times daily for 10 days. 04/28/20 05/08/20  Danton Clap, PA-C  meloxicam (MOBIC) 15 MG tablet Take 1 tablet (15 mg total) by mouth daily as needed for pain. 07/23/19   Coral Spikes, DO  amitriptyline (ELAVIL) 10 MG tablet Take 10 mg by mouth at bedtime.  07/23/19  [provider]    Family History Family History  Problem Relation Age of Onset  . Atrial fibrillation Mother   . Atrial fibrillation Father     Social History Social History   Tobacco Use  . Smoking status: Current Every Day Smoker    Packs/day: 0.50    Years: 25.00    Pack years: 12.50    Types: Cigarettes  . Smokeless tobacco: Never Used  Vaping Use  . Vaping Use: Never used  Substance Use Topics  . Alcohol use: Yes    Alcohol/week: 0.0 standard drinks    Comment: occasionally  . Drug use: No     Allergies   Patient has no known allergies.   Review of Systems Review of Systems  Constitutional: Negative for  fatigue and fever.  Gastrointestinal: Negative for abdominal pain, diarrhea, nausea and vomiting.  Genitourinary: Negative for difficulty urinating, dysuria and vaginal discharge.  Musculoskeletal: Positive for arthralgias. Negative for back pain, gait problem and joint swelling.  Skin: Negative for rash and wound.  Neurological: Positive for numbness. Negative for weakness and headaches.     Physical Exam Triage Vital Signs ED Triage Vitals  Enc Vitals Group     BP 04/28/20 1648 (!) 142/63     Pulse Rate 04/28/20 1648 66     Resp 04/28/20 1648 18     Temp 04/28/20 1648 98.9 F (37.2 C)     Temp Source 04/28/20 1648 Oral     SpO2 04/28/20 1648 99 %     Weight 04/28/20 1646 199 lb 15.3 oz (90.7 kg)     Height 04/28/20 1646 5\' 8"  (1.727 m)     Head Circumference --       Peak Flow --      Pain Score 04/28/20 1645 8     Pain Loc --      Pain Edu? --      Excl. in [place]? --    No data found.  Updated Vital Signs BP (!) 142/63 (BP Location: Right Arm)   Pulse 66   Temp 98.9 F (37.2 C) (Oral)   Resp 18   Ht 5\' 8"  (1.727 m)   Wt 199 lb 15.3 oz (90.7 kg)   SpO2 99%   BMI 30.40 kg/m    Physical Exam Vitals and nursing note reviewed.  Constitutional:      General: She is not in acute distress.    Appearance: Normal appearance. She is not ill-appearing or toxic-appearing.  HENT:     Head: Normocephalic and atraumatic.  Eyes:     General: No scleral icterus.       Right eye: No discharge.        Left eye: No discharge.     Conjunctiva/sclera: Conjunctivae normal.  Cardiovascular:     Rate and Rhythm: Normal rate and regular rhythm.     Heart sounds: Normal heart sounds.  Pulmonary:     Effort: Pulmonary effort is normal. No respiratory distress.     Breath sounds: Normal breath sounds.  Musculoskeletal:     Cervical back: Neck supple.     Lumbar back: Tenderness (TTP L4-S1, TTP right lumbar paravertebral muscles) present. No signs of trauma. Normal range of motion. Negative right straight leg raise test and negative left straight leg raise test.     Right hip: Tenderness (TTP lateral thigh over greater trocanter. Muscle spasms lateral thigh) present. Normal range of motion. Normal strength.     Left hip: Normal strength.     Comments: Painful internal rotation of hip  Skin:    General: Skin is dry.     Findings: No rash.  Neurological:     General: No focal deficit present.     Mental Status: She is alert. Mental status is at baseline.     Motor: No weakness.     Gait: Gait normal.  Psychiatric:        Mood and Affect: Mood normal.        Behavior: Behavior normal.        Thought Content: Thought content normal.      UC Treatments / Results  Labs (all labs ordered are listed, but only abnormal results are displayed) Labs Reviewed -  No data to display  EKG  Radiology No results found.  Procedures Procedures (including critical care time)  Medications Ordered in UC Medications - No data to display  Initial Impression / Assessment and Plan / UC Course  I have reviewed the triage vital signs and the nursing notes.  Pertinent labs & imaging results that were available during my care of the patient were reviewed by me and considered in my medical decision making (see chart for details).   54 year old female presenting with vague complaints of right hip discomfort associated with some tingling at times.  On exam she has tenderness of the right lateral hip and painful internal rotation.  She also has some tenderness of the right lower back and L4-S1 vertebrae.  Discussed with patient possible causes for her discomfort including trochanteric bursitis, hip strain, lumbar strain, or the beginning of shingles.  Advised NSAIDs and trying muscle relaxer.  Advised her to call us if she notices a rash in the area so that we can send an antiviral medication for her.  If no improvement over the next week or symptoms are worsening she should be seen again either here or through her PCP.  Final Clinical Impressions(s) / UC Diagnoses   Final diagnoses:  Right hip pain  Paresthesia     Discharge Instructions     -Her symptoms today are most likely due to musculoskeletal cause such as trochanteric bursitis of the hip, muscle spasms, or other back condition.  Begin anti-inflammatory medications, rest, ice.  If you notice a rash in the area please call back as pain is usually before rash with shingles.     ED Prescriptions    Medication Sig Dispense Auth. Provider   diclofenac (VOLTAREN) 75 MG EC tablet Take 1 tablet (75 mg total) by mouth 2 (two) times daily for 10 days. 20 tablet Laurene Footman B, PA-C   cyclobenzaprine (FLEXERIL) 10 MG tablet Take 1 tablet (10 mg total) by mouth 3 (three) times daily as needed for up to 10 days  for muscle spasms. 20 tablet Gretta Cool     PDMP not reviewed this encounter.   Danton Clap, PA-C 04/28/20 1729

## 2020-05-07 ENCOUNTER — Ambulatory Visit: Payer: Self-pay

## 2020-05-07 ENCOUNTER — Ambulatory Visit
Admission: EM | Admit: 2020-05-07 | Discharge: 2020-05-07 | Disposition: A | Discharge status: Home / Self Care | Payer: HRSA Program | Attending: Physician Assistant | Admitting: Physician Assistant

## 2020-05-07 ENCOUNTER — Other Ambulatory Visit: Payer: Self-pay

## 2020-05-07 ENCOUNTER — Encounter: Payer: Self-pay | Admitting: Emergency Medicine

## 2020-05-07 DIAGNOSIS — Z20822 Contact with and (suspected) exposure to covid-19: Secondary | ICD-10-CM | POA: Diagnosis present

## 2020-05-07 DIAGNOSIS — B349 Viral infection, unspecified: Secondary | ICD-10-CM | POA: Diagnosis not present

## 2020-05-07 DIAGNOSIS — R05 Cough: Secondary | ICD-10-CM | POA: Diagnosis present

## 2020-05-07 DIAGNOSIS — R059 Cough, unspecified: Secondary | ICD-10-CM

## 2020-05-07 NOTE — ED Triage Notes (Signed)
Patient c/o cough, and runny nose that started 2 days ago.  Patient denies fevers.  Patient states that she was exposed to a coworker on Monday that tested positive for COVID.

## 2020-05-07 NOTE — Discharge Instructions (Addendum)
You have received COVID testing today either for positive exposure, concerning symptoms that could be related to COVID infection, screening purposes, or re-testing after confirmed positive.  Your test obtained today checks for active viral infection in the last 1-2 weeks. If your test is negative now, you can still test positive later. So, if you do develop symptoms you should either get re-tested and/or isolate x 10 days. Please follow CDC guidelines.  While Rapid antigen tests come back in 15-20 minutes, send out PCR/molecular test results typically come back within 24 hours. In the mean time, if you are symptomatic, assume this could be a positive test and treat/monitor yourself as if you do have COVID.   We will call with test results. Please download the MyChart app and set up a profile to access test results.   If symptomatic, go home and rest. Push fluids. Take Tylenol as needed for discomfort. Gargle warm salt water. Throat lozenges. Take Mucinex DM or Robitussin for cough. Humidifier in bedroom to ease coughing. Warm showers. Also review the COVID handout for more information.  COVID-19 INFECTION: The incubation period of COVID-19 is approximately 14 days after exposure, with most symptoms developing in roughly 4-5 days. Symptoms may range in severity from mild to critically severe. Roughly 80% of those infected will have mild symptoms. People of any age may become infected with COVID-19 and have the ability to transmit the virus. The most common symptoms include: fever, fatigue, cough, body aches, headaches, sore throat, nasal congestion, shortness of breath, nausea, vomiting, diarrhea, changes in smell and/or taste.    COURSE OF ILLNESS Some patients may begin with mild disease which can progress quickly into critical symptoms. If your symptoms are worsening please call ahead to the Emergency Department and proceed there for further treatment. Recovery time appears to be roughly 1-2 weeks for  mild symptoms and 3-6 weeks for severe disease.   GO IMMEDIATELY TO ER FOR FEVER YOU ARE UNABLE TO GET DOWN WITH TYLENOL, BREATHING PROBLEMS, CHEST PAIN, FATIGUE, LETHARGY, INABILITY TO EAT OR DRINK, ETC  QUARANTINE AND ISOLATION: To help decrease the spread of COVID-19 please remain isolated if you have COVID infection or are highly suspected to have COVID infection. This means -stay home and isolate to one room in the home if you live with others. Do not share a bed or bathroom with others while ill, sanitize and wipe down all countertops and keep common areas clean and disinfected. You may discontinue isolation if you have a mild case and are asymptomatic 10 days after symptom onset as long as you have been fever free >24 hours without having to take Motrin or Tylenol. If your case is more severe (meaning you develop pneumonia or are admitted in the hospital), you may have to isolate longer.   If you have been in close contact (within 6 feet) of someone diagnosed with COVID 19, you are advised to quarantine in your home for 14 days as symptoms can develop anywhere from 2-14 days after exposure to the virus. If you develop symptoms, you  must isolate.  Most current guidelines for COVID after exposure -isolate 10 days if you ARE NOT tested for COVID as long as symptoms do not develop -isolate 7 days if you are tested and remain asymptomatic -You do not necessarily need to be tested for COVID if you have + exposure and        develop   symptoms. Just isolate at home x10 days from symptom onset During   this global pandemic, CDC advises to practice social distancing, try to stay at least 50ft away from others at all times. Wear a face covering. Wash and sanitize your hands regularly and avoid going anywhere that is not necessary.  KEEP IN MIND THAT THE COVID TEST IS NOT 100% ACCURATE AND YOU SHOULD STILL DO EVERYTHING TO PREVENT POTENTIAL SPREAD OF VIRUS TO OTHERS (WEAR MASK, WEAR GLOVES, [place] HANDS AND  SANITIZE REGULARLY). IF INITIAL TEST IS NEGATIVE, THIS MAY NOT MEAN YOU ARE DEFINITELY NEGATIVE. MOST ACCURATE TESTING IS DONE 5-7 DAYS AFTER EXPOSURE.   It is not advised by CDC to get re-tested after receiving a positive COVID test since you can still test positive for weeks to months after you have already cleared the virus.   *If you have not been vaccinated for COVID, I strongly suggest you consider getting vaccinated as long as there are no contraindications.    URI/COLD SYMPTOMS: Your exam today is consistent with a viral illness. Antibiotics are not indicated at this time. Use medications as directed, including cough syrup, nasal saline, and decongestants. Your symptoms should improve over the next few days and resolve within 7-10 days. Increase rest and fluids. F/u if symptoms worsen or predominate such as sore throat, ear pain, productive cough, shortness of breath, or if you develop high fevers or worsening fatigue over the next several days.

## 2020-05-07 NOTE — ED Provider Notes (Signed)
[facility]    CSN: [id number] Arrival date & time: 05/07/20  0808      History   Chief Complaint Chief Complaint  Patient presents with  . Cough    HPI Theresa Sanders is a 54 y.o. female.   54 y/o female presents for 2 day history of cough, congestion and sore throat. Patient was exposed to COVID earlier this week. She is fully vaccinated for COVID.  Denies fever, fatigue, body aches, chest pain or shortness of breath, abdominal pain, nausea, vomiting, diarrhea, changes in smell or taste.  Patient is otherwise healthy without any cardiopulmonary disease.  Says she has not really needed to take any over-the-counter cough medication.  Patient with no other concerns today.     Past Medical History:  Diagnosis Date  . Anxiety   . Depression   . GERD (gastroesophageal reflux disease)     Patient Active Problem List   Diagnosis Date Noted  . Anxiety 01/03/2015  . Clinical depression 01/03/2015  . Diverticulitis 01/03/2015  . Gout 01/03/2015  . HPV test positive 01/03/2015  . HLD (hyperlipidemia) 01/03/2015  . Avitaminosis D 01/03/2015    Past Surgical History:  Procedure Laterality Date  . ABDOMINAL SURGERY    . FOOT SURGERY Bilateral     OB History   No obstetric history on file.      Home Medications    Prior to Admission medications   Medication Sig Start Date End Date Taking? Authorizing Provider  ALPRAZolam Duanne Moron) 0.5 MG tablet Take 0.5 mg by mouth at bedtime as needed for anxiety.    [provider]  cyclobenzaprine (FLEXERIL) 10 MG tablet Take 1 tablet (10 mg total) by mouth 3 (three) times daily as needed for up to 10 days for muscle spasms. 04/28/20 05/08/20  Danton Clap, PA-C  diclofenac (VOLTAREN) 75 MG EC tablet Take 1 tablet (75 mg total) by mouth 2 (two) times daily for 10 days. 04/28/20 05/08/20  Danton Clap, PA-C  meloxicam (MOBIC) 15 MG tablet Take 1 tablet (15 mg total) by mouth daily as needed for pain. 07/23/19   Coral Spikes, DO  omeprazole (PRILOSEC) 20 MG capsule Take 20 mg by mouth daily.    [provider]  PARAGARD INTRAUTERINE COPPER IU by Intrauterine route.    [provider]  PARoxetine (PAXIL) 20 MG tablet TAKE ONE TABLET BY MOUTH ONCE DAILY 12/31/14   [provider]  amitriptyline (ELAVIL) 10 MG tablet Take 10 mg by mouth at bedtime.  07/23/19  [provider]    Family History Family History  Problem Relation Age of Onset  . Atrial fibrillation Mother   . Atrial fibrillation Father     Social History Social History   Tobacco Use  . Smoking status: Current Every Day Smoker    Packs/day: 0.50    Years: 25.00    Pack years: 12.50    Types: Cigarettes  . Smokeless tobacco: Never Used  Vaping Use  . Vaping Use: Never used  Substance Use Topics  . Alcohol use: Yes    Alcohol/week: 0.0 standard drinks    Comment: occasionally  . Drug use: No     Allergies   Patient has no known allergies.   Review of Systems Review of Systems  Constitutional: Negative for chills, diaphoresis, fatigue and fever.  HENT: Positive for congestion and sore throat (resolved). Negative for ear pain, rhinorrhea, sinus pressure and sinus pain.   Respiratory: Positive for cough.  Negative for shortness of breath.   Gastrointestinal: Negative for abdominal pain, nausea and vomiting.  Musculoskeletal: Negative for arthralgias and myalgias.  Skin: Negative for rash.  Neurological: Negative for weakness and headaches.  Hematological: Negative for adenopathy.     Physical Exam Triage Vital Signs ED Triage Vitals  Enc Vitals Group     BP 05/07/20 0818 (!) 137/91     Pulse Rate 05/07/20 0818 71     Resp 05/07/20 0818 16     Temp 05/07/20 0818 98.6 F (37 C)     Temp Source 05/07/20 0818 Oral     SpO2 05/07/20 0818 100 %     Weight 05/07/20 0815 189 lb (85.7 kg)     Height 05/07/20 0815 5\' 8"  (1.727 m)     Head Circumference --      Peak Flow --      Pain  Score 05/07/20 0815 0     Pain Loc --      Pain Edu? --      Excl. in [place]? --    No data found.  Updated Vital Signs BP (!) 137/91 (BP Location: Right Arm)   Pulse 71   Temp 98.6 F (37 C) (Oral)   Resp 16   Ht 5\' 8"  (1.727 m)   Wt 189 lb (85.7 kg)   SpO2 100%   BMI 28.74 kg/m    Physical Exam Vitals and nursing note reviewed.  Constitutional:      General: She is not in acute distress.    Appearance: Normal appearance. She is not ill-appearing or toxic-appearing.  HENT:     Head: Normocephalic and atraumatic.     Nose: Congestion present.     Mouth/Throat:     Mouth: Mucous membranes are moist.     Pharynx: Oropharynx is clear.  Eyes:     General: No scleral icterus.       Right eye: No discharge.        Left eye: No discharge.     Conjunctiva/sclera: Conjunctivae normal.  Cardiovascular:     Rate and Rhythm: Normal rate and regular rhythm.     Heart sounds: Normal heart sounds.  Pulmonary:     Effort: Pulmonary effort is normal. No respiratory distress.     Breath sounds: Normal breath sounds.  Musculoskeletal:     Cervical back: Neck supple.  Skin:    General: Skin is dry.  Neurological:     General: No focal deficit present.     Mental Status: She is alert. Mental status is at baseline.     Motor: No weakness.     Gait: Gait normal.  Psychiatric:        Mood and Affect: Mood normal.        Behavior: Behavior normal.        Thought Content: Thought content normal.      UC Treatments / Results  Labs (all labs ordered are listed, but only abnormal results are displayed) Labs Reviewed  SARS CORONAVIRUS 2 (TAT 6-24 HRS)    EKG   Radiology No results found.  Procedures Procedures (including critical care time)  Medications Ordered in UC Medications - No data to display  Initial Impression / Assessment and Plan / UC Course  I have reviewed the triage vital signs and the nursing notes.  Pertinent labs & imaging results that were available  during my care of the patient were reviewed by me and considered in my medical decision making (see  chart for details).    Positive exposure to Covid and symptoms x2 days.  Advised 10 days of isolation if positive. Suspicion for COVID infection is high. Exam normal other than nasal congestion. VSS, Chest is CTA and heart RRR. Patient declined cough medication. Advised continuing OTC meds and f/u if new/worsening symptoms or not better in 7-10 days.  CDC guidelines for Covid discussed as well as isolation protocol.  ED red flags also discussed.  Final Clinical Impressions(s) / UC Diagnoses   Final diagnoses:  Viral illness  Cough  Exposure to COVID-19 virus     Discharge Instructions     You have received COVID testing today either for positive exposure, concerning symptoms that could be related to COVID infection, screening purposes, or re-testing after confirmed positive.  Your test obtained today checks for active viral infection in the last 1-2 weeks. If your test is negative now, you can still test positive later. So, if you do develop symptoms you should either get re-tested and/or isolate x 10 days. Please follow CDC guidelines.  While Rapid antigen tests come back in 15-20 minutes, send out PCR/molecular test results typically come back within 24 hours. In the mean time, if you are symptomatic, assume this could be a positive test and treat/monitor yourself as if you do have COVID.   We will call with test results. Please download the MyChart app and set up a profile to access test results.   If symptomatic, go home and rest. Push fluids. Take Tylenol as needed for discomfort. Gargle warm salt water. Throat lozenges. Take Mucinex DM or Robitussin for cough. Humidifier in bedroom to ease coughing. Warm showers. Also review the COVID handout for more information.  COVID-19 INFECTION: The incubation period of COVID-19 is approximately 14 days after exposure, with most symptoms developing  in roughly 4-5 days. Symptoms may range in severity from mild to critically severe. Roughly 80% of those infected will have mild symptoms. People of any age may become infected with COVID-19 and have the ability to transmit the virus. The most common symptoms include: fever, fatigue, cough, body aches, headaches, sore throat, nasal congestion, shortness of breath, nausea, vomiting, diarrhea, changes in smell and/or taste.    COURSE OF ILLNESS Some patients may begin with mild disease which can progress quickly into critical symptoms. If your symptoms are worsening please call ahead to the Emergency Department and proceed there for further treatment. Recovery time appears to be roughly 1-2 weeks for mild symptoms and 3-6 weeks for severe disease.   GO IMMEDIATELY TO ER FOR FEVER YOU ARE UNABLE TO GET DOWN WITH TYLENOL, BREATHING PROBLEMS, CHEST PAIN, FATIGUE, LETHARGY, INABILITY TO EAT OR DRINK, ETC  QUARANTINE AND ISOLATION: To help decrease the spread of COVID-19 please remain isolated if you have COVID infection or are highly suspected to have COVID infection. This means -stay home and isolate to one room in the home if you live with others. Do not share a bed or bathroom with others while ill, sanitize and wipe down all countertops and keep common areas clean and disinfected. You may discontinue isolation if you have a mild case and are asymptomatic 10 days after symptom onset as long as you have been fever free >24 hours without having to take Motrin or Tylenol. If your case is more severe (meaning you develop pneumonia or are admitted in the hospital), you may have to isolate longer.   If you have been in close contact (within 6 feet) of someone  diagnosed with COVID 19, you are advised to quarantine in your home for 14 days as symptoms can develop anywhere from 2-14 days after exposure to the virus. If you develop symptoms, you  must isolate.  Most current guidelines for COVID after exposure -isolate  10 days if you ARE NOT tested for COVID as long as symptoms do not develop -isolate 7 days if you are tested and remain asymptomatic -You do not necessarily need to be tested for COVID if you have + exposure and        develop   symptoms. Just isolate at home x10 days from symptom onset During this global pandemic, CDC advises to practice social distancing, try to stay at least 58ft away from others at all times. Wear a face covering. Wash and sanitize your hands regularly and avoid going anywhere that is not necessary.  KEEP IN MIND THAT THE COVID TEST IS NOT 100% ACCURATE AND YOU SHOULD STILL DO EVERYTHING TO PREVENT POTENTIAL SPREAD OF VIRUS TO OTHERS (WEAR MASK, WEAR GLOVES, [place] HANDS AND SANITIZE REGULARLY). IF INITIAL TEST IS NEGATIVE, THIS MAY NOT MEAN YOU ARE DEFINITELY NEGATIVE. MOST ACCURATE TESTING IS DONE 5-7 DAYS AFTER EXPOSURE.   It is not advised by CDC to get re-tested after receiving a positive COVID test since you can still test positive for weeks to months after you have already cleared the virus.   *If you have not been vaccinated for COVID, I strongly suggest you consider getting vaccinated as long as there are no contraindications.    URI/COLD SYMPTOMS: Your exam today is consistent with a viral illness. Antibiotics are not indicated at this time. Use medications as directed, including cough syrup, nasal saline, and decongestants. Your symptoms should improve over the next few days and resolve within 7-10 days. Increase rest and fluids. F/u if symptoms worsen or predominate such as sore throat, ear pain, productive cough, shortness of breath, or if you develop high fevers or worsening fatigue over the next several days.      ED Prescriptions    None     PDMP not reviewed this encounter.   Danton Clap, PA-C 05/07/20 [phone]

## 2020-05-08 LAB — SARS CORONAVIRUS 2 (TAT 6-24 HRS): SARS Coronavirus 2: NEGATIVE

## 2020-07-11 ENCOUNTER — Other Ambulatory Visit: Payer: Self-pay

## 2020-07-11 ENCOUNTER — Ambulatory Visit
Admission: EM | Admit: 2020-07-11 | Discharge: 2020-07-11 | Disposition: A | Discharge status: Home / Self Care | Payer: Self-pay | Attending: Emergency Medicine | Admitting: Emergency Medicine

## 2020-07-11 ENCOUNTER — Encounter: Payer: Self-pay | Admitting: Emergency Medicine

## 2020-07-11 ENCOUNTER — Ambulatory Visit (INDEPENDENT_AMBULATORY_CARE_PROVIDER_SITE_OTHER): Payer: Self-pay

## 2020-07-11 ENCOUNTER — Ambulatory Visit: Payer: Self-pay

## 2020-07-11 DIAGNOSIS — M25552 Pain in left hip: Secondary | ICD-10-CM

## 2020-07-11 DIAGNOSIS — R030 Elevated blood-pressure reading, without diagnosis of hypertension: Secondary | ICD-10-CM

## 2020-07-11 MED ORDER — MELOXICAM 15 MG PO TABS: 15.0000 mg | ORAL_TABLET | Freq: Every day | ORAL | 0 refills | Status: AC

## 2020-07-11 MED ORDER — PREDNISONE 10 MG (21) PO TBPK: ORAL_TABLET | ORAL | 0 refills | Status: DC

## 2020-07-11 NOTE — ED Triage Notes (Signed)
Patient in today c/o left hip pain x 3 weeks. No injury noted. Patient has taken OTC Ibuprofen, Aleve and Tylenol without relief.

## 2020-07-11 NOTE — ED Provider Notes (Signed)
HPI  SUBJECTIVE:  Theresa Sanders is a 54 y.o. female who presents with 3 weeks of constant left hip pain starting her lateral hip radiating into her groin.  Becomes sharp with activity, is otherwise dull, achy.  She reports hip weakness secondary to the pain, denies true weakness.  She reports very  occasional back pain.  No trauma to the hip, change in physical activity, abdominal pain, rash, groin swelling, distal numbness or tingling.  No fevers, redness, recent viral illness.  She denies knee pain.  She states this is not similar to her right hip pain that she was seen here for several years ago.  She has been alternating 800 mg ibuprofen with 1000 m of Tylenol in between every 6 hours, heat, ice, Aleve without improvement in her symptoms.  Symptoms are worse with getting into her car, palpation and weightbearing.  Past medical history negative for prolonged steroid use, osteoporosis, diabetes, hypertension, AVN, hip injury or fracture, chronic kidney disease, GI bleed, peptic ulcer disease.  PMD: Dr. Janene Harvey at [facility]   Past Medical History:  Diagnosis Date  . Anxiety   . Depression   . GERD (gastroesophageal reflux disease)     Past Surgical History:  Procedure Laterality Date  . ABDOMINAL SURGERY    . FOOT SURGERY Bilateral     Family History  Problem Relation Age of Onset  . Atrial fibrillation Mother   . Hypertension Mother   . Atrial fibrillation Father   . Atrial fibrillation Sister   . Atrial fibrillation Brother     Social History   Tobacco Use  . Smoking status: Current Every Day Smoker    Packs/day: 0.50    Years: 25.00    Pack years: 12.50    Types: Cigarettes  . Smokeless tobacco: Never Used  Vaping Use  . Vaping Use: Never used  Substance Use Topics  . Alcohol use: Yes    Alcohol/week: 0.0 standard drinks    Comment: occasionally  . Drug use: No    No current facility-administered medications for this encounter.  Current Outpatient  Medications:  .  ALPRAZolam (XANAX) 0.5 MG tablet, Take 0.5 mg by mouth at bedtime as needed for anxiety., Disp: , Rfl:  .  omeprazole (PRILOSEC) 20 MG capsule, Take 20 mg by mouth daily., Disp: , Rfl:  .  PARAGARD INTRAUTERINE COPPER IU, by Intrauterine route., Disp: , Rfl:  .  PARoxetine (PAXIL) 20 MG tablet, TAKE ONE TABLET BY MOUTH ONCE DAILY, Disp: , Rfl:  .  meloxicam (MOBIC) 15 MG tablet, Take 1 tablet (15 mg total) by mouth daily for 7 days., Disp: 7 tablet, Rfl: 0 .  predniSONE (STERAPRED UNI-PAK 21 TAB) 10 MG (21) TBPK tablet, Dispense one 6 day pack. Take as directed with food., Disp: 21 tablet, Rfl: 0  No Known Allergies   ROS  As noted in HPI.   Physical Exam  BP (!) 174/71 (BP Location: Left Arm)   Pulse 68   Temp 98.8 F (37.1 C) (Oral)   Resp 18   Ht 5\' 8"  (1.727 m)   Wt 90.7 kg   SpO2 100%   BMI 30.41 kg/m   Constitutional: Well developed, well nourished, appears uncomfortable Eyes:  EOMI, conjunctiva normal bilaterally HENT: Normocephalic, atraumatic,mucus membranes moist Respiratory: Normal inspiratory effort Cardiovascular: Normal rate GI: nondistended skin: No rash, skin intact GU: External labia equal, nontender.  No inguinal swelling, bulging, tenderness. Musculoskeletal: No signs of trauma L hip. No bruising, erythema, rash.  No  muscular tenderness over gluteal muscles, down IT band.  Positive tenderness along the upper thigh extending medially.  Distal quadriceps nontender.  No appreciable muscle spasm.  No pain with passive abduction/adduction of leg. pain with int/ext rotation hip.  Pain with passive hip extension.  No pain with hip flexion.  No tenderness at sciatic notch. Flexion/extension knee WNL.  Knee nontender.  No pain with knee flexion or extension.  Motor strength flexion/ext hip 5/5. Sensation to LT intact.  PT 2+.  Gait antalgic No L-spine, paralumbar tenderness.  Neurologic: Alert & oriented x 3, no focal neuro deficits Psychiatric:  Speech and behavior appropriate   ED Course   Medications - No data to display  Orders Placed This Encounter  Procedures  . DG Hip Unilat With Pelvis 2-3 Views Left    Standing Status:   Standing    Number of Occurrences:   1    Order Specific Question:   Symptom/Reason for Exam    Answer:   Left hip pain [[id number]]    No results found for this or any previous visit (from the past 24 hour(s)). DG Hip Unilat With Pelvis 2-3 Views Left  Result Date: 07/11/2020 CLINICAL DATA:  Pain for 3 weeks EXAM: DG HIP (WITH OR WITHOUT PELVIS) 2-3V LEFT COMPARISON:  None. FINDINGS: Frontal pelvis as well as frontal and lateral left hip images obtained. No fracture or dislocation. There is slight symmetric narrowing of each hip joint. No erosive change. Intrauterine device present in mid pelvis. IMPRESSION: No fracture or dislocation. Mild symmetric narrowing each hip joint. Intrauterine device in mid pelvis. Electronically Signed   By: Lowella Grip III M.D.   On: 07/11/2020 09:26    ED Clinical Impression  1. Elevated blood-pressure reading without diagnosis of hypertension   2. Left hip pain      ED Assessment/Plan  1.  Left hip pain.  Suspect trochanteric bursitis.  Doubt septic arthritis.  Patient is tearful, limping.  Unable to give her Toradol/Tylenol here she took ibuprofen and Tylenol 2 hours prior to evaluation.  Will check left hip x-ray for osteoarthritis, AVN, fracture.  She has tenderness along the lateral hip at the greater trochanter and along the inner thigh.  It does not appear to be an inguinal hernia. home with Mobic/Tylenol, prednisone.  Follow-up with [facility] if not getting better with this.  To the ER if she gets worse or for fevers.  Reviewed imaging independently.  No fracture, dislocation.  Mild symmetric narrowing each hip joint.  See radiology report for full details.  2.  Elevated blood pressure.  Patient appears to be in pain.  Advised her to keep an eye on this  and the follow-up with her primary care physician if it stays elevated.  Discussed imaging, MDM, treatment plan, and plan for follow-up with patient. Discussed sn/sx that should prompt return to the ED. patient agrees with plan.   Meds ordered this encounter  Medications  . meloxicam (MOBIC) 15 MG tablet    Sig: Take 1 tablet (15 mg total) by mouth daily for 7 days.    Dispense:  7 tablet    Refill:  0  . predniSONE (STERAPRED UNI-PAK 21 TAB) 10 MG (21) TBPK tablet    Sig: Dispense one 6 day pack. Take as directed with food.    Dispense:  21 tablet    Refill:  0    *This clinic note was created using [employer]. Therefore, there may be occasional mistakes  despite careful proofreading.   ?    Melynda Ripple, MD 07/11/20 [phone]

## 2020-07-11 NOTE — Discharge Instructions (Addendum)
I suspect that you have a bursitis in your hip.  Take the Mobic in the morning with 1000 mg of Tylenol.  May take an additional 1000 g of Tylenol 2 or 3 more times in a day.  Do not exceed more than 4000 mg of Tylenol from all sources in 1 day.  Prednisone may also help.  Follow-up with [facility] if these medications do not help.  Also keep an eye on your blood pressure.  I suspect it was elevated because of pain today.  Decrease your salt intake. diet and exercise will lower your blood pressure significantly. It is important to keep your blood pressure under good control, as having a elevated blood pressure for prolonged periods of time significantly increases your risk of stroke, heart attacks, kidney damage, eye damage, and other problems. Measure your blood pressure once a day, preferably at the same time every day. Keep a log of this and bring it to your next doctor's appointment.  Bring your blood pressure cuff as well.   Return immediately to the ER if you start having chest pain, headache, problems seeing, problems talking, problems walking, if you feel like you're about to pass out, if you do pass out, if you have a seizure, or for any other concerns.  Go to www.goodrx.com to look up your medications. This will give you a list of where you can find your prescriptions at the most affordable prices. Or ask the pharmacist what the cash price is, or if they have any other discount programs available to help make your medication more affordable. This can be less expensive than what you would pay with insurance.

## 2020-09-23 ENCOUNTER — Encounter: Payer: Self-pay | Admitting: Emergency Medicine

## 2020-12-06 ENCOUNTER — Ambulatory Visit: Payer: Self-pay

## 2020-12-07 ENCOUNTER — Ambulatory Visit: Payer: Self-pay

## 2021-10-10 DIAGNOSIS — D649 Anemia, unspecified: Secondary | ICD-10-CM | POA: Insufficient documentation

## 2021-10-10 DIAGNOSIS — K219 Gastro-esophageal reflux disease without esophagitis: Secondary | ICD-10-CM | POA: Insufficient documentation

## 2021-10-10 DIAGNOSIS — N924 Excessive bleeding in the premenopausal period: Secondary | ICD-10-CM | POA: Insufficient documentation

## 2022-05-29 ENCOUNTER — Ambulatory Visit
Admission: EM | Admit: 2022-05-29 | Discharge: 2022-05-29 | Disposition: A | Discharge status: Home / Self Care | Payer: 59 | Attending: Family Medicine | Admitting: Family Medicine

## 2022-05-29 ENCOUNTER — Encounter: Payer: Self-pay | Admitting: Emergency Medicine

## 2022-05-29 DIAGNOSIS — I160 Hypertensive urgency: Secondary | ICD-10-CM | POA: Diagnosis not present

## 2022-05-29 DIAGNOSIS — I10 Essential (primary) hypertension: Secondary | ICD-10-CM

## 2022-05-29 HISTORY — DX: Essential (primary) hypertension: I10

## 2022-05-29 LAB — BASIC METABOLIC PANEL
Anion gap: 6 (ref 5–15)
BUN: 9 mg/dL (ref 6–20)
CO2: 26 mmol/L (ref 22–32)
Calcium: 8.6 mg/dL — ABNORMAL LOW (ref 8.9–10.3)
Chloride: 103 mmol/L (ref 98–111)
Creatinine, Ser: 0.63 mg/dL (ref 0.44–1.00)
GFR, Estimated: >60 mL/min (ref >60)
Glucose, Bld: 107 mg/dL — ABNORMAL HIGH (ref 70–99)
Potassium: 4.4 mmol/L (ref 3.5–5.1)
Sodium: 135 mmol/L (ref 135–145)

## 2022-05-29 MED ORDER — CLONIDINE HCL 0.2 MG PO TABS
0.2000 mg | ORAL_TABLET | Freq: Once | ORAL | Status: AC
  Administered 2022-05-29: 0.2 mg via ORAL

## 2022-05-29 MED ORDER — LOSARTAN POTASSIUM 50 MG PO TABS: 50.0000 mg | ORAL_TABLET | Freq: Every day | ORAL | 0 refills | Status: AC

## 2022-05-29 MED ORDER — HYDROCHLOROTHIAZIDE 12.5 MG PO CAPS: 12.5000 mg | ORAL_CAPSULE | Freq: Every day | ORAL | 0 refills | Status: AC

## 2022-05-29 NOTE — ED Triage Notes (Signed)
Pt went to the dentist yesterday and her BP was elevated. Pt has a history of hypertension but does not take any medication for it. Pt c/o tightness in the neck/shoulders.

## 2022-05-29 NOTE — ED Provider Notes (Signed)
[facility]    CSN: [id number] Arrival date & time: 05/29/22  7782      History   Chief Complaint Chief Complaint  Patient presents with   Hypertension    HPI Theresa Sanders is a 56 y.o. female.   HPI  Theresa Sanders presents for elevated blood pressures.  States she has a history of high blood pressure but is not currently taking any medication for this.  Says that her PCP wanted to start her on medication.  She was placed to keep a journal of her blood pressures but she has not done this.  She denies any chest pain, shortness of breath, palpitations, lower extremity edema, exertional dyspnea, lightheadedness.  Endorses headaches and blurry vision.   She has been under more stress recently as 2 of her sons are getting married, she started a new job and chipped her tooth.  She is experiencing some tightness in her muscles in her chest and upper back.   Of note, she went to the dentist yesterday to get a tooth fixed and was told her blood pressures were really high 190s/ 110s.  This morning, she had 200s /100s and called her PCP but she was unable to get an appointment.  She was advised to be seen at the urgent care.  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      Home Medications    Prior to Admission medications   Medication Sig Start Date End Date Taking? Authorizing Provider  clonazePAM (KLONOPIN) 1 MG tablet Take by mouth. 10/16/21  Yes [provider]  hydrochlorothiazide  (MICROZIDE) 12.5 MG capsule Take 1 capsule (12.5 mg total) by mouth daily. 05/29/22  Yes Abir Eroh, DO  losartan (COZAAR) 50 MG tablet Take 1 tablet (50 mg total) by mouth daily. 05/29/22  Yes Elmar Antigua, DO  omeprazole (PRILOSEC) 20 MG capsule Take 20 mg by mouth daily.   Yes [provider]  ondansetron (ZOFRAN-ODT) 4 MG disintegrating tablet Take by mouth. 10/03/21  Yes [provider]  PARAGARD INTRAUTERINE COPPER IU by Intrauterine route.   Yes [provider]  PARoxetine (PAXIL) 20 MG tablet TAKE ONE TABLET BY MOUTH ONCE DAILY 12/31/14  Yes [provider]  predniSONE (STERAPRED UNI-PAK 21 TAB) 10 MG (21) TBPK tablet Dispense one 6 day pack. Take as directed with food. 07/11/20   Melynda Ripple, MD  amitriptyline (ELAVIL) 10 MG tablet Take 10 mg by mouth at bedtime.  07/23/19  [provider]    Family History Family History  Problem Relation Age of Onset   Atrial fibrillation Mother    Hypertension Mother    Atrial fibrillation Father    Atrial fibrillation Sister    Atrial fibrillation Brother     Social History Social History   Tobacco Use   Smoking status: Every Day    Packs/day: 0.50    Years: 25.00    Total pack years: 12.50  Types: Cigarettes   Smokeless tobacco: Never  Vaping Use   Vaping Use: Never used  Substance Use Topics   Alcohol use: Yes    Alcohol/week: 0.0 standard drinks of alcohol    Comment: occasionally   Drug use: No     Allergies   Patient has no known allergies.   Review of Systems Review of Systems : negative unless otherwise stated in HPI.      Physical Exam Triage Vital Signs ED Triage Vitals  Enc Vitals Group     BP      Pulse      Resp      Temp      Temp src      SpO2      Weight      Height      Head Circumference      Peak Flow      Pain Score      Pain Loc      Pain Edu?      Excl. in [place]?    No data found.  Updated Vital Signs BP (!) 171/102 (BP  Location: Right Arm)   Pulse (!) 59   Temp 98.1 F (36.7 C) (Oral)   Resp 16   LMP 05/26/2022   SpO2 97%   Visual Acuity Right Eye Distance:   Left Eye Distance:   Bilateral Distance:    Right Eye Near:   Left Eye Near:    Bilateral Near:     Physical Exam  GEN: alert, well appearing female, in no acute distress    HENT:  no nasal discharge  EYES:   pupils equal and reactive, EOM intact NECK:  supple, normal ROM RESP:  clear to auscultation bilaterally, no increased work of breathing  CVS:   regular rate and rhythm, no murmur, distal pulses intact, no JVP EXT:   normal ROM, atraumatic, no edema  NEURO:  normal without focal findings,  speech normal, alert and oriented   Skin:   warm and dry Psych: Normal affect, appropriate speech and behavior    UC Treatments / Results  Labs (all labs ordered are listed, but only abnormal results are displayed) Labs Reviewed  BASIC METABOLIC PANEL - Abnormal; Notable for the following components:      Result Value   Glucose, Bld 107 (*)    Calcium 8.6 (*)    All other components within normal limits    EKG   Radiology No results found.  Procedures Procedures (including critical care time)  Medications Ordered in UC Medications  cloNIDine (CATAPRES) tablet 0.2 mg (0.2 mg Oral Given 05/29/22 0901)    Initial Impression / Assessment and Plan / UC Course  I have reviewed the triage vital signs and the nursing notes.  Pertinent labs & imaging results that were available during my care of the patient were reviewed by me and considered in my medical decision making (see chart for details).     Pt is a 56 yo female who presents for elevated blood pressures. She is currently not taking any medications.  On chart review, she has had elevated blood pressures intermittently since 2017.  Blood pressure is uncontrolled.  Patient blood pressure today 203/88.  She was given 0.2 mg of clonidine and repeat blood pressure 171/102.  She has  stable serum creatinine.  KG without acute ST or T wave changes, sinus bradycardia, personally reviewed by me.  We will start her on dual therapy (HCTZ and losartan).  She  is to follow-up with her PCP this week.  Work note given per patient request. Encouraged to increase physical activity as tolerated. Heart healthy dietary choices discussed.   Discussed MDM, treatment plan and plan for follow-up with patient/parent who agrees with plan.         Final Clinical Impressions(s) / UC Diagnoses   Final diagnoses:  Hypertensive urgency     Discharge Instructions      Please monitor your blood pressure at home first thing in the morning prior to eating and drinking and journal this for PCP follow-up.  If you remain persistently elevated after your symptoms improve, you may need alterations in your blood pressure regimen.   Other recommendations for high blood pressure are to decrease the amount of salt in your diet, exercise (150 minutes of moderate intensity exercise weekly), weight loss.  You should follow up with your primary doctor in 3-4 days regarding today's urgent care visit. If your symptoms persist, you may need a additional cardiovascular evaluation.  Go to the emergency department if you develop inability to keep down fluids, severe diarrhea, blood in your vomit or stools, worsening abdominal pain, fever over 100.4 F that is not resolved with Tylenol, severe chest pain, you pass out, or any additional symptoms that concern you.   I hope you feel better soon and it was our pleasure caring for you today.      ED Prescriptions     Medication Sig Dispense Auth. Provider   hydrochlorothiazide (MICROZIDE) 12.5 MG capsule Take 1 capsule (12.5 mg total) by mouth daily. 30 capsule Ravindra Baranek, DO   losartan (COZAAR) 50 MG tablet Take 1 tablet (50 mg total) by mouth daily. 30 tablet Lyndee Hensen, DO      PDMP not reviewed this encounter.   Lyndee Hensen, DO 05/29/22  1017

## 2022-05-29 NOTE — Discharge Instructions (Addendum)
Please monitor your blood pressure at home first thing in the morning prior to eating and drinking and journal this for PCP follow-up.  If you remain persistently elevated after your symptoms improve, you may need alterations in your blood pressure regimen.   Other recommendations for high blood pressure are to decrease the amount of salt in your diet, exercise (150 minutes of moderate intensity exercise weekly), weight loss.  You should follow up with your primary doctor in 3-4 days regarding today's urgent care visit. If your symptoms persist, you may need a additional cardiovascular evaluation.  Go to the emergency department if you develop inability to keep down fluids, severe diarrhea, blood in your vomit or stools, worsening abdominal pain, fever over 100.4 F that is not resolved with Tylenol, severe chest pain, you pass out, or any additional symptoms that concern you.   I hope you feel better soon and it was our pleasure caring for you today.

## 2022-08-19 ENCOUNTER — Ambulatory Visit
Admission: RE | Admit: 2022-08-19 | Discharge: 2022-08-19 | Disposition: A | Discharge status: Home / Self Care | Payer: 59 | Source: Ambulatory Visit | Attending: Family Medicine | Admitting: Family Medicine

## 2022-08-19 VITALS — BP 122/79 | HR 62 | Temp 99.1°F | Resp 18

## 2022-08-19 DIAGNOSIS — Z8719 Personal history of other diseases of the digestive system: Secondary | ICD-10-CM | POA: Insufficient documentation

## 2022-08-19 DIAGNOSIS — Z8739 Personal history of other diseases of the musculoskeletal system and connective tissue: Secondary | ICD-10-CM | POA: Insufficient documentation

## 2022-08-19 DIAGNOSIS — J441 Chronic obstructive pulmonary disease with (acute) exacerbation: Secondary | ICD-10-CM | POA: Diagnosis present

## 2022-08-19 DIAGNOSIS — Z1152 Encounter for screening for COVID-19: Secondary | ICD-10-CM | POA: Diagnosis not present

## 2022-08-19 LAB — RESP PANEL BY RT-PCR (RSV, FLU A&B, COVID)  RVPGX2
Influenza A by PCR: NEGATIVE
Influenza B by PCR: NEGATIVE
Resp Syncytial Virus by PCR: NEGATIVE
SARS Coronavirus 2 by RT PCR: NEGATIVE

## 2022-08-19 LAB — GROUP A STREP BY PCR: Group A Strep by PCR: NOT DETECTED

## 2022-08-19 MED ORDER — ALBUTEROL SULFATE HFA 108 (90 BASE) MCG/ACT IN AERS: 2.0000 | INHALATION_SPRAY | RESPIRATORY_TRACT | 0 refills | Status: AC | PRN

## 2022-08-19 MED ORDER — PROMETHAZINE-DM 6.25-15 MG/5ML PO SYRP: 5.0000 mL | ORAL_SOLUTION | Freq: Four times a day (QID) | ORAL | 0 refills | Status: DC | PRN

## 2022-08-19 MED ORDER — DOXYCYCLINE HYCLATE 100 MG PO CAPS: 100.0000 mg | ORAL_CAPSULE | Freq: Two times a day (BID) | ORAL | 0 refills | Status: AC

## 2022-08-19 MED ORDER — PREDNISONE 10 MG (21) PO TBPK: ORAL_TABLET | ORAL | 0 refills | Status: DC

## 2022-08-19 NOTE — Discharge Instructions (Signed)
We will contact you if your COVID/influenza test is positive.  Please quarantine while you wait for the results.  If your test is negative you may resume normal activities.  If your test is positive please continue to quarantine for at least 5 days from your symptom onset or until you are without a fever for at least 24 hours after the medications.    If your were prescribed medication, stop by the pharmacy to pick them up.   You can take Tylenol and/or Ibuprofen as needed for fever reduction and pain relief.    For cough: honey 1/2 to 1 teaspoon (you can dilute the honey in water or another fluid).  You can also use guaifenesin and dextromethorphan for cough. You can use a humidifier for chest congestion and cough.  If you don't have a humidifier, you can sit in the bathroom with the hot shower running.      For sore throat: try warm salt water gargles, Mucinex sore throat cough drops or cepacol lozenges, throat spray, warm tea or water with lemon/honey, popsicles or ice, or OTC cold relief medicine for throat discomfort. You can also purchase chloraseptic spray at the pharmacy or dollar store.   For congestion: take a daily anti-histamine like Zyrtec, Claritin, and a oral decongestant, such as pseudoephedrine.  You can also use Flonase 1-2 sprays in each nostril daily. Afrin is also a good option, if you do not have high blood pressure.    It is important to stay hydrated: drink plenty of fluids (water, gatorade/powerade/pedialyte, juices, or teas) to keep your throat moisturized and help further relieve irritation/discomfort.    Return or go to the Emergency Department if symptoms worsen or do not improve in the next few days  

## 2022-08-19 NOTE — ED Provider Notes (Signed)
[facility]    CSN: [id number] Arrival date & time: 08/19/22  0809      History   Chief Complaint Chief Complaint  Patient presents with   Cough    Sore throat and cough for a week - Entered by patient   Sore Throat    HPI MYEISHA KRUSER is a 56 y.o. female.   HPI   Josalyn presents for sore throat that started last Sunday and cough that started on Wednesday.  Cough has been gotten progressively worse.  She feels like her head is like a bowling ball.  She has history of smoking but has not been able to smoke since her cough started on Wednesday as it hurts too bad.  Last night she started having shortness of breath and used her inhaler which she has not needed in very long time.  She has history of COPD.  She notes that she is had a elevated temperature at home of 99.6 F.  There has been no vomiting or diarrhea.  She does endorse some nasal congestion that started a couple days ago.       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      Home Medications    Prior to Admission medications   Medication Sig Start Date End Date Taking? Authorizing Provider  albuterol (VENTOLIN HFA) 108 (90 Base) MCG/ACT inhaler Inhale 2 puffs into the lungs every  4 (four) hours as needed. 08/19/22  Yes Mackynzie Woolford, DO  doxycycline (VIBRAMYCIN) 100 MG capsule Take 1 capsule (100 mg total) by mouth 2 (two) times daily for 5 days. 08/19/22 08/24/22 Yes Salvador Coupe, DO  promethazine-dextromethorphan (PROMETHAZINE-DM) 6.25-15 MG/5ML syrup Take 5 mLs by mouth 4 (four) times daily as needed. 08/19/22  Yes Eriq Hufford, DO  clonazePAM (KLONOPIN) 1 MG tablet Take by mouth. 10/16/21   [provider]  hydrochlorothiazide (MICROZIDE) 12.5 MG capsule Take 1 capsule (12.5 mg total) by mouth daily. 05/29/22   Jenyfer Trawick, Ronnette Juniper, DO  losartan (COZAAR) 50 MG tablet Take 1 tablet (50 mg total) by mouth daily. 05/29/22   Lyndee Hensen, DO  omeprazole (PRILOSEC) 20 MG capsule Take 20 mg by mouth daily.    [provider]  ondansetron (ZOFRAN-ODT) 4 MG disintegrating tablet Take by mouth. 10/03/21   [provider]  PARAGARD INTRAUTERINE COPPER IU by Intrauterine route.    [provider]  PARoxetine (PAXIL) 20 MG tablet TAKE ONE TABLET BY MOUTH ONCE DAILY 12/31/14   [provider]  predniSONE (STERAPRED UNI-PAK 21 TAB) 10 MG (21) TBPK tablet Dispense one 6 day pack. Take as directed with food. 08/19/22   Taria Castrillo, Ronnette Juniper, DO  amitriptyline (ELAVIL) 10 MG tablet Take  10 mg by mouth at bedtime.  07/23/19  [provider]    Family History Family History  Problem Relation Age of Onset   Atrial fibrillation Mother    Hypertension Mother    Atrial fibrillation Father    Atrial fibrillation Sister    Atrial fibrillation Brother     Social History Social History   Tobacco Use   Smoking status: Every Day    Packs/day: 0.50    Years: 25.00    Total pack years: 12.50    Types: Cigarettes   Smokeless tobacco: Never  Vaping Use   Vaping Use: Never used  Substance Use Topics   Alcohol use: Yes    Alcohol/week: 0.0 standard drinks of alcohol    Comment: occasionally   Drug use: No     Allergies   Patient  has no known allergies.   Review of Systems Review of Systems: negative unless otherwise stated in HPI.      Physical Exam Triage Vital Signs ED Triage Vitals  Enc Vitals Group     BP 08/19/22 0822 122/79     Pulse Rate 08/19/22 0822 62     Resp 08/19/22 0822 18     Temp 08/19/22 0822 99.1 F (37.3 C)     Temp Source 08/19/22 0822 Oral     SpO2 08/19/22 0822 97 %     Weight --      Height --      Head Circumference --      Peak Flow --      Pain Score 08/19/22 0821 0     Pain Loc --      Pain Edu? --      Excl. in [place]? --    No data found.  Updated Vital Signs BP 122/79 (BP Location: Right Arm)   Pulse 62   Temp 99.1 F (37.3 C) (Oral)   Resp 18   SpO2 97%   Visual Acuity Right Eye Distance:   Left Eye Distance:   Bilateral Distance:    Right Eye Near:   Left Eye Near:    Bilateral Near:     Physical Exam GEN:     alert, non-toxic appearing female in no distress    HENT:  mucus membranes moist, oropharyngeal without lesions or exudate, no tonsillar hypertrophy, mild oropharyngeal erythema, moderate erythematous edematous turbinates, clear nasal discharge, bilateral TM normal, maxillary sinus tenderness bilaterally EYES:   pupils equal and reactive, no scleral injection or discharge NECK:  normal ROM, anterior cervical lymphadenopathy, no meningismus   RESP:  no increased work of breathing, rhonchi, no wheezing, no rales CVS:   regular rate and rhythm Skin:   warm and dry    UC Treatments / Results  Labs (all labs ordered are listed, but only abnormal results are displayed) Labs Reviewed  GROUP A STREP BY PCR  RESP PANEL BY RT-PCR (RSV, FLU A&B, COVID)  [id number]    EKG   Radiology No results found.  Procedures Procedures (including critical care time)  Medications Ordered in UC Medications - No data to display  Initial Impression / Assessment and Plan / UC Course  I have reviewed the triage vital signs and the nursing notes.  Pertinent labs  & imaging results that were available during my care of the patient were reviewed by me and considered in my medical decision making (see chart for details).       Pt is a 56 y.o. female with history of COPD and  ongoing tobacco use who presents for worsening  respiratory symptoms. Siedah has an elevated temperature here of 99.1 F. Satting well on room air. Overall pt is non-toxic appearing, well hydrated, without respiratory distress. Pulmonary exam is unremarkable.  COVID, RSV and influenza testing obtained and were negative. Strep PCR obtained.  Patient called with test results.  I suspect she may be having a COPD flare given the negative testing.  Treat with steroids and antibiotics as below.  Promethazine DM for cough.  Albuterol refilled.  Discussed symptomatic treatment.   Typical duration of symptoms discussed.   Return and ED precautions given and voiced understanding. Discussed MDM, treatment plan and plan for follow-up with patient who agrees with plan.     Final Clinical Impressions(s) / UC Diagnoses   Final diagnoses:  COPD exacerbation [facility])     Discharge Instructions      We will contact you if your COVID/influenza test is positive.  Please quarantine while you wait for the results.  If your test is negative you may resume normal activities.  If your test is positive please continue to quarantine for at least 5 days from your symptom onset or until you are without a fever for at least 24 hours after the medications.    If your were prescribed medication, stop by the pharmacy to pick them up.   You can take Tylenol and/or Ibuprofen as needed for fever reduction and pain relief.    For cough: honey 1/2 to 1 teaspoon (you can dilute the honey in water or another fluid).  You can also use guaifenesin and dextromethorphan for cough. You can use a humidifier for chest congestion and cough.  If you don't have a humidifier, you can sit in the bathroom with the hot shower running.       For sore throat: try warm salt water gargles, Mucinex sore throat cough drops or cepacol lozenges, throat spray, warm tea or water with lemon/honey, popsicles or ice, or OTC cold relief medicine for throat discomfort. You can also purchase chloraseptic spray at the pharmacy or dollar store.   For congestion: take a daily anti-histamine like Zyrtec, Claritin, and a oral decongestant, such as pseudoephedrine.  You can also use Flonase 1-2 sprays in each nostril daily. Afrin is also a good option, if you do not have high blood pressure.    It is important to stay hydrated: drink plenty of fluids (water, gatorade/powerade/pedialyte, juices, or teas) to keep your throat moisturized and help further relieve irritation/discomfort.    Return or go to the Emergency Department if symptoms worsen or do not improve in the next few days      ED Prescriptions     Medication Sig Dispense Auth. Provider   promethazine-dextromethorphan (PROMETHAZINE-DM) 6.25-15 MG/5ML syrup Take 5 mLs by mouth 4 (four) times daily as needed. 118 mL Altan Kraai, DO   albuterol (VENTOLIN HFA) 108 (90 Base) MCG/ACT inhaler Inhale 2 puffs into the lungs every 4 (four) hours as needed. 6.7 g Lorrie Strauch, DO   predniSONE (STERAPRED UNI-PAK 21 TAB) 10 MG (21) TBPK tablet Dispense one 6 day pack. Take as directed with food. 21 tablet Coral Soler, DO   doxycycline (VIBRAMYCIN) 100 MG capsule Take 1 capsule (100 mg total) by mouth 2 (two) times daily for 5 days. 10 capsule Lyndee Hensen, DO      PDMP not reviewed this encounter.   Lyndee Hensen, DO 08/19/22 1012

## 2022-08-19 NOTE — ED Triage Notes (Signed)
Patient presents to UC for sore throat and productive cough x 7 days. Fever developed yesterday. Treating with mucinex and inhaler.

## 2022-09-27 ENCOUNTER — Encounter: Payer: Self-pay | Admitting: *Deleted

## 2022-09-28 ENCOUNTER — Encounter: Payer: Self-pay | Admitting: *Deleted

## 2022-09-28 ENCOUNTER — Other Ambulatory Visit: Payer: Self-pay

## 2022-09-28 ENCOUNTER — Ambulatory Visit: Payer: 59 | Admitting: Anesthesiology

## 2022-09-28 ENCOUNTER — Ambulatory Visit
Admission: RE | Admit: 2022-09-28 | Discharge: 2022-09-28 | Disposition: A | Discharge status: Home / Self Care | Payer: 59 | Source: Ambulatory Visit | Attending: Gastroenterology | Admitting: Gastroenterology

## 2022-09-28 ENCOUNTER — Encounter
Admission: RE | Disposition: A | Discharge status: Home / Self Care | Payer: Self-pay | Source: Ambulatory Visit | Attending: Gastroenterology

## 2022-09-28 DIAGNOSIS — I10 Essential (primary) hypertension: Secondary | ICD-10-CM | POA: Diagnosis not present

## 2022-09-28 DIAGNOSIS — F419 Anxiety disorder, unspecified: Secondary | ICD-10-CM | POA: Diagnosis not present

## 2022-09-28 DIAGNOSIS — K5730 Diverticulosis of large intestine without perforation or abscess without bleeding: Secondary | ICD-10-CM | POA: Insufficient documentation

## 2022-09-28 DIAGNOSIS — D120 Benign neoplasm of cecum: Secondary | ICD-10-CM | POA: Insufficient documentation

## 2022-09-28 DIAGNOSIS — F17200 Nicotine dependence, unspecified, uncomplicated: Secondary | ICD-10-CM | POA: Insufficient documentation

## 2022-09-28 DIAGNOSIS — D124 Benign neoplasm of descending colon: Secondary | ICD-10-CM | POA: Diagnosis not present

## 2022-09-28 DIAGNOSIS — Z1211 Encounter for screening for malignant neoplasm of colon: Secondary | ICD-10-CM | POA: Diagnosis not present

## 2022-09-28 DIAGNOSIS — K640 First degree hemorrhoids: Secondary | ICD-10-CM | POA: Diagnosis not present

## 2022-09-28 DIAGNOSIS — J449 Chronic obstructive pulmonary disease, unspecified: Secondary | ICD-10-CM | POA: Diagnosis not present

## 2022-09-28 DIAGNOSIS — K219 Gastro-esophageal reflux disease without esophagitis: Secondary | ICD-10-CM | POA: Diagnosis present

## 2022-09-28 DIAGNOSIS — Z980 Intestinal bypass and anastomosis status: Secondary | ICD-10-CM | POA: Diagnosis not present

## 2022-09-28 DIAGNOSIS — K449 Diaphragmatic hernia without obstruction or gangrene: Secondary | ICD-10-CM | POA: Insufficient documentation

## 2022-09-28 DIAGNOSIS — Z79899 Other long term (current) drug therapy: Secondary | ICD-10-CM | POA: Diagnosis not present

## 2022-09-28 DIAGNOSIS — F32A Depression, unspecified: Secondary | ICD-10-CM | POA: Diagnosis not present

## 2022-09-28 HISTORY — PX: ESOPHAGOGASTRODUODENOSCOPY (EGD) WITH PROPOFOL: SHX5813

## 2022-09-28 HISTORY — PX: COLONOSCOPY WITH PROPOFOL: SHX5780

## 2022-09-28 LAB — POCT PREGNANCY, URINE: Preg Test, Ur: NEGATIVE

## 2022-09-28 SURGERY — COLONOSCOPY WITH PROPOFOL
Anesthesia: General

## 2022-09-28 MED ORDER — LIDOCAINE HCL (CARDIAC) PF 100 MG/5ML IV SOSY
PREFILLED_SYRINGE | INTRAVENOUS | Status: DC | PRN
  Administered 2022-09-28: 100 mg via INTRAVENOUS

## 2022-09-28 MED ORDER — SODIUM CHLORIDE 0.9 % IV SOLN: INTRAVENOUS | Status: DC

## 2022-09-28 MED ORDER — PROPOFOL 10 MG/ML IV BOLUS
INTRAVENOUS | Status: DC | PRN
  Administered 2022-09-28: 40 mg via INTRAVENOUS
  Administered 2022-09-28: 50 mg via INTRAVENOUS
  Administered 2022-09-28 (×2): 40 mg via INTRAVENOUS
  Administered 2022-09-28: 120 mg via INTRAVENOUS
  Administered 2022-09-28: 40 mg via INTRAVENOUS
  Administered 2022-09-28 (×2): 60 mg via INTRAVENOUS
  Administered 2022-09-28: 40 mg via INTRAVENOUS

## 2022-09-28 MED ORDER — EPHEDRINE SULFATE (PRESSORS) 50 MG/ML IJ SOLN
INTRAMUSCULAR | Status: DC | PRN
  Administered 2022-09-28 (×2): 10 mg via INTRAVENOUS

## 2022-09-28 NOTE — Interval H&P Note (Signed)
History and Physical Interval Note:  09/28/2022 8:27 AM  Theresa Sanders  has presented today for surgery, with the diagnosis of GERD,CCA SCREEN.  The various methods of treatment have been discussed with the patient and family. After consideration of risks, benefits and other options for treatment, the patient has consented to  Procedure(s): COLONOSCOPY WITH PROPOFOL (N/A) ESOPHAGOGASTRODUODENOSCOPY (EGD) WITH PROPOFOL (N/A) as a surgical intervention.  The patient's history has been reviewed, patient examined, no change in status, stable for surgery.  I have reviewed the patient's chart and labs.  Questions were answered to the patient's satisfaction.     [name]  Ok to proceed with EGD/Colonoscopy

## 2022-09-28 NOTE — H&P (Signed)
Outpatient short stay form Pre-procedure 09/28/2022  [name], MD  Primary Physician: Sherre Scarlet, PA-C  Reason for visit:  GERD/Screening colonoscopy  History of present illness:    57 y/o lady with history of hypertension, depression, and GERD here for EGD/Colonoscopy for GERD and colon cancer screening. Interestingly about 10 years ago she had here sigmoid colon removed for diverticulitis but never had a colonoscopy. No blood thinners. No first degree relatives with GI malignancies.    Current Facility-Administered Medications:    0.9 %  sodium chloride infusion, , Intravenous, Continuous, Lavette Yankovich, Hilton Cork, MD, Last Rate: 20 mL/hr at 09/28/22 0825, Continued from Pre-op at 09/28/22 0825  Medications Prior to Admission  Medication Sig Dispense Refill Last Dose   clonazePAM (KLONOPIN) 1 MG tablet Take by mouth.   Past Week   hydrochlorothiazide (MICROZIDE) 12.5 MG capsule Take 1 capsule (12.5 mg total) by mouth daily. 30 capsule 0 09/28/2022   losartan (COZAAR) 50 MG tablet Take 1 tablet (50 mg total) by mouth daily. 30 tablet 0 09/28/2022   omeprazole (PRILOSEC) 20 MG capsule Take 20 mg by mouth daily.   09/28/2022   PARoxetine (PAXIL) 20 MG tablet TAKE ONE TABLET BY MOUTH ONCE DAILY   09/28/2022   albuterol (VENTOLIN HFA) 108 (90 Base) MCG/ACT inhaler Inhale 2 puffs into the lungs every 4 (four) hours as needed. 6.7 g 0    ondansetron (ZOFRAN-ODT) 4 MG disintegrating tablet Take by mouth.      PARAGARD INTRAUTERINE COPPER IU by Intrauterine route.      predniSONE (STERAPRED UNI-PAK 21 TAB) 10 MG (21) TBPK tablet Dispense one 6 day pack. Take as directed with food. (Patient not taking: Reported on 09/28/2022) 21 tablet 0 Completed Course   promethazine-dextromethorphan (PROMETHAZINE-DM) 6.25-15 MG/5ML syrup Take 5 mLs by mouth 4 (four) times daily as needed. (Patient not taking: Reported on 09/28/2022) 118 mL 0 Completed Course     No Known Allergies   Past Medical  History:  Diagnosis Date   Anxiety    Depression    GERD (gastroesophageal reflux disease)    Hypertension     Review of systems:  Otherwise negative.    Physical Exam  Gen: Alert, oriented. Appears stated age.  HEENT: PERRLA. Lungs: No respiratory distress CV: RRR Abd: soft, benign, no masses Ext: No edema    Planned procedures: Proceed with EGD/colonoscopy. The patient understands the nature of the planned procedure, indications, risks, alternatives and potential complications including but not limited to bleeding, infection, perforation, damage to internal organs and possible oversedation/side effects from anesthesia. The patient agrees and gives consent to proceed.  Please refer to procedure notes for findings, recommendations and patient disposition/instructions.     [name], MD [facility]

## 2022-09-28 NOTE — Op Note (Signed)
[facility] Gastroenterology Patient Name: Theresa Sanders Procedure Date: 09/28/2022 8:15 AM MRN: [id number] Account #: [id number] Date of Birth: [birth year] Admit Type: Outpatient Age: 57 Room: [facility] ENDO ROOM 3 Gender: Female Note Status: Finalized Instrument Name: Altamese Cabal Endoscope [id number] Procedure:             Upper GI endoscopy Indications:           Gastro-esophageal reflux disease Providers:             Andrey Farmer MD, MD Medicines:             Monitored Anesthesia Care Complications:         No immediate complications. Procedure:             Pre-Anesthesia Assessment:                        - Prior to the procedure, a History and Physical was                         performed, and patient medications and allergies were                         reviewed. The patient is competent. The risks and                         benefits of the procedure and the sedation options and                         risks were discussed with the patient. All questions                         were answered and informed consent was obtained.                         Patient identification and proposed procedure were                         verified by the physician, the nurse, the                         anesthesiologist, the anesthetist and the technician                         in the endoscopy suite. Mental Status Examination:                         alert and oriented. Airway Examination: normal                         oropharyngeal airway and neck mobility. Respiratory                         Examination: clear to auscultation. CV Examination:                         normal. Prophylactic Antibiotics: The patient does not                         require prophylactic antibiotics. Prior  Anticoagulants: The patient has taken no anticoagulant                         or antiplatelet agents. ASA Grade Assessment: II - A                         patient with mild  systemic disease. After reviewing                         the risks and benefits, the patient was deemed in                         satisfactory condition to undergo the procedure. The                         anesthesia plan was to use monitored anesthesia care                         (MAC). Immediately prior to administration of                         medications, the patient was re-assessed for adequacy                         to receive sedatives. The heart rate, respiratory                         rate, oxygen saturations, blood pressure, adequacy of                         pulmonary ventilation, and response to care were                         monitored throughout the procedure. The physical                         status of the patient was re-assessed after the                         procedure.                        After obtaining informed consent, the endoscope was                         passed under direct vision. Throughout the procedure,                         the patient's blood pressure, pulse, and oxygen                         saturations were monitored continuously. The Endoscope                         was introduced through the mouth, and advanced to the                         second part of duodenum. The upper GI endoscopy was  accomplished without difficulty. The patient tolerated                         the procedure well. Findings:      The Z-line was irregular.      A small hiatal hernia was present.      The exam of the esophagus was otherwise normal.      The entire examined stomach was normal.      The examined duodenum was normal. Impression:            - Z-line irregular.                        - Small hiatal hernia.                        - Normal stomach.                        - Normal examined duodenum.                        - No specimens collected. Recommendation:        - Perform a colonoscopy today. Procedure Code(s):      --- Professional ---                        [phone], Esophagogastroduodenoscopy, flexible,                         transoral; diagnostic, including collection of                         specimen(s) by brushing or washing, when performed                         (separate procedure) Diagnosis Code(s):     --- Professional ---                        K22.89, Other specified disease of esophagus                        K44.9, Diaphragmatic hernia without obstruction or                         gangrene                        K21.9, Gastro-esophageal reflux disease without                         esophagitis CPT copyright 2022 American [employer] Association. All rights reserved. The codes documented in this report are preliminary and upon coder review may  be revised to meet current compliance requirements. Andrey Farmer MD, MD 09/28/2022 9:04:43 AM Number of Addenda: 0 Note Initiated On: 09/28/2022 8:15 AM Estimated Blood Loss:  Estimated blood loss: none.      [facility]

## 2022-09-28 NOTE — Op Note (Signed)
[facility] Gastroenterology Patient Name: Theresa Sanders Procedure Date: 09/28/2022 8:14 AM MRN: [id number] Account #: [id number] Date of Birth: 03-11-1966 Admit Type: Outpatient Age: 57 Room: [facility] ENDO ROOM 3 Gender: Female Note Status: Finalized Instrument Name: Jasper Riling [id number] Procedure:             Colonoscopy Indications:           Screening for colorectal malignant neoplasm Providers:             Andrey Farmer MD, MD Medicines:             Monitored Anesthesia Care Complications:         No immediate complications. Estimated blood loss:                         Minimal. Procedure:             Pre-Anesthesia Assessment:                        - Prior to the procedure, a History and Physical was                         performed, and patient medications and allergies were                         reviewed. The patient is competent. The risks and                         benefits of the procedure and the sedation options and                         risks were discussed with the patient. All questions                         were answered and informed consent was obtained.                         Patient identification and proposed procedure were                         verified by the physician, the nurse, the                         anesthesiologist, the anesthetist and the technician                         in the endoscopy suite. Mental Status Examination:                         alert and oriented. Airway Examination: normal                         oropharyngeal airway and neck mobility. Respiratory                         Examination: clear to auscultation. CV Examination:                         normal. Prophylactic Antibiotics: The patient does not  require prophylactic antibiotics. Prior                         Anticoagulants: The patient has taken no anticoagulant                         or antiplatelet agents. ASA Grade  Assessment: II - A                         patient with mild systemic disease. After reviewing                         the risks and benefits, the patient was deemed in                         satisfactory condition to undergo the procedure. The                         anesthesia plan was to use monitored anesthesia care                         (MAC). Immediately prior to administration of                         medications, the patient was re-assessed for adequacy                         to receive sedatives. The heart rate, respiratory                         rate, oxygen saturations, blood pressure, adequacy of                         pulmonary ventilation, and response to care were                         monitored throughout the procedure. The physical                         status of the patient was re-assessed after the                         procedure.                        After obtaining informed consent, the colonoscope was                         passed under direct vision. Throughout the procedure,                         the patient's blood pressure, pulse, and oxygen                         saturations were monitored continuously. The                         Colonoscope was introduced through the anus and  advanced to the the terminal ileum. The colonoscopy                         was performed without difficulty. The patient                         tolerated the procedure well. The quality of the bowel                         preparation was adequate to identify polyps. The                         terminal ileum was photographed. Findings:      The perianal and digital rectal examinations were normal.      The terminal ileum appeared normal.      A 3 mm polyp was found in the cecum. The polyp was sessile. The polyp       was removed with a cold snare. Resection and retrieval were complete.       Estimated blood loss was minimal.      A few  small-mouthed diverticula were found in the descending colon and       transverse colon.      Two sessile polyps were found in the descending colon. The polyps were 3       to 4 mm in size. These polyps were removed with a cold snare. Resection       and retrieval were complete. Estimated blood loss was minimal.      There was evidence of a prior end-to-end colo-colonic anastomosis in the       descending colon. This was patent and was characterized by healthy       appearing mucosa. The anastomosis was traversed.      Internal hemorrhoids were found during retroflexion. The hemorrhoids       were Grade I (internal hemorrhoids that do not prolapse).      The exam was otherwise without abnormality on direct and retroflexion       views. Impression:            - The examined portion of the ileum was normal.                        - One 3 mm polyp in the cecum, removed with a cold                         snare. Resected and retrieved.                        - Diverticulosis in the descending colon and in the                         transverse colon.                        - Two 3 to 4 mm polyps in the descending colon,                         removed with a cold snare. Resected and retrieved.                        -  Patent end-to-end colo-colonic anastomosis,                         characterized by healthy appearing mucosa.                        - Internal hemorrhoids.                        - The examination was otherwise normal on direct and                         retroflexion views. Recommendation:        - Discharge patient to home.                        - Resume previous diet.                        - Continue present medications.                        - Await pathology results.                        - Repeat colonoscopy in 3 years for surveillance.                        - Return to referring physician as previously                         scheduled. Procedure Code(s):      --- Professional ---                        [phone], Colonoscopy, flexible; with removal of                         tumor(s), polyp(s), or other lesion(s) by snare                         technique Diagnosis Code(s):     --- Professional ---                        Z12.11, Encounter for screening for malignant neoplasm                         of colon                        K64.0, First degree hemorrhoids                        D12.0, Benign neoplasm of cecum                        D12.4, Benign neoplasm of descending colon                        Z98.0, Intestinal bypass and anastomosis status                        K57.30, Diverticulosis of large intestine without  perforation or abscess without bleeding CPT copyright 2022 American Medical Association. All rights reserved. The codes documented in this report are preliminary and upon coder review may  be revised to meet current compliance requirements. Andrey Farmer MD, MD 09/28/2022 9:09:10 AM Number of Addenda: 0 Note Initiated On: 09/28/2022 8:14 AM Scope Withdrawal Time: 0 hours 18 minutes 6 seconds  Total Procedure Duration: 0 hours 20 minutes 35 seconds  Estimated Blood Loss:  Estimated blood loss was minimal.      [facility]

## 2022-09-28 NOTE — Anesthesia Preprocedure Evaluation (Addendum)
Anesthesia Evaluation  Patient identified by MRN, date of birth, ID band Patient awake    Reviewed: Allergy & Precautions, NPO status , Patient's Chart, lab work & pertinent test results  Airway Mallampati: II  TM Distance: >3 FB Neck ROM: full    Dental no notable dental hx.    Pulmonary COPD,  COPD inhaler, Current Smoker   Pulmonary exam normal        Cardiovascular hypertension, On Medications Normal cardiovascular exam     Neuro/Psych  PSYCHIATRIC DISORDERS Anxiety Depression    negative neurological ROS     GI/Hepatic Neg liver ROS,GERD  Medicated,,  Endo/Other  negative endocrine ROS    Renal/GU negative Renal ROS  negative genitourinary   Musculoskeletal   Abdominal   Peds  Hematology negative hematology ROS (+)   Anesthesia Other Findings Past Medical History: No date: Anxiety No date: Depression No date: GERD (gastroesophageal reflux disease) No date: Hypertension  Past Surgical History: No date: ABDOMINAL SURGERY No date: FOOT SURGERY; Bilateral     Reproductive/Obstetrics negative OB ROS                             Anesthesia Physical Anesthesia Plan  ASA: 2  Anesthesia Plan: General   Post-op Pain Management: Minimal or no pain anticipated   Induction: Intravenous  PONV Risk Score and Plan: Propofol infusion and TIVA  Airway Management Planned: Natural Airway and Nasal Cannula  Additional Equipment:   Intra-op Plan:   Post-operative Plan:   Informed Consent: I have reviewed the patients History and Physical, chart, labs and discussed the procedure including the risks, benefits and alternatives for the proposed anesthesia with the patient or authorized representative who has indicated his/her understanding and acceptance.     Dental Advisory Given  Plan Discussed with: Anesthesiologist, CRNA and Surgeon  Anesthesia Plan Comments: (Patient consented  for risks of anesthesia including but not limited to:  - adverse reactions to medications - risk of airway placement if required - damage to eyes, teeth, lips or other oral mucosa - nerve damage due to positioning  - sore throat or hoarseness - Damage to heart, brain, nerves, lungs, other parts of body or loss of life  Patient voiced understanding.)       Anesthesia Quick Evaluation

## 2022-09-28 NOTE — Transfer of Care (Signed)
Immediate Anesthesia Transfer of Care Note  Patient: Theresa Sanders  Procedure(s) Performed: COLONOSCOPY WITH PROPOFOL ESOPHAGOGASTRODUODENOSCOPY (EGD) WITH PROPOFOL  Patient Location: Endoscopy Unit  Anesthesia Type:General  Level of Consciousness: drowsy  Airway & Oxygen Therapy: Patient Spontanous Breathing and Patient connected to nasal cannula oxygen  Post-op Assessment: Report given to RN, Post -op Vital signs reviewed and stable, and Patient moving all extremities  Post vital signs: Reviewed and stable  Last Vitals:  Vitals Value Taken Time  BP 88/51 09/28/22 0900  Temp    Pulse 65 09/28/22 0900  Resp 21 09/28/22 0900  SpO2 94 % 09/28/22 0900    Last Pain:  Vitals:   09/28/22 0900  TempSrc:   PainSc: Asleep         Complications: No notable events documented.

## 2022-09-28 NOTE — Anesthesia Postprocedure Evaluation (Signed)
Anesthesia Post Note  Patient: Theresa Sanders  Procedure(s) Performed: COLONOSCOPY WITH PROPOFOL ESOPHAGOGASTRODUODENOSCOPY (EGD) WITH PROPOFOL  Patient location during evaluation: Endoscopy Anesthesia Type: General Level of consciousness: awake and alert Pain management: pain level controlled Vital Signs Assessment: post-procedure vital signs reviewed and stable Respiratory status: spontaneous breathing, nonlabored ventilation, respiratory function stable and patient connected to nasal cannula oxygen Cardiovascular status: blood pressure returned to baseline and stable Postop Assessment: no apparent nausea or vomiting Anesthetic complications: no   No notable events documented.   Last Vitals:  Vitals:   09/28/22 0910 09/28/22 0920  BP: (!) 105/45 (!) 107/58  Pulse: 69 (!) 58  Resp: 13 13  Temp:    SpO2: 98% 100%    Last Pain:  Vitals:   09/28/22 0920  TempSrc:   PainSc: 0-No pain                 Ilene Qua

## 2022-10-01 ENCOUNTER — Encounter: Payer: Self-pay | Admitting: Gastroenterology

## 2022-10-01 LAB — SURGICAL PATHOLOGY

## 2023-09-09 ENCOUNTER — Other Ambulatory Visit: Payer: Self-pay | Admitting: Physician Assistant

## 2023-09-09 DIAGNOSIS — Z1231 Encounter for screening mammogram for malignant neoplasm of breast: Secondary | ICD-10-CM

## 2023-11-12 ENCOUNTER — Ambulatory Visit
Admission: EM | Admit: 2023-11-12 | Discharge: 2023-11-12 | Disposition: A | Discharge status: Home / Self Care | Attending: Emergency Medicine | Admitting: Emergency Medicine

## 2023-11-12 DIAGNOSIS — N390 Urinary tract infection, site not specified: Secondary | ICD-10-CM | POA: Diagnosis present

## 2023-11-12 DIAGNOSIS — M545 Low back pain, unspecified: Secondary | ICD-10-CM | POA: Insufficient documentation

## 2023-11-12 DIAGNOSIS — R319 Hematuria, unspecified: Secondary | ICD-10-CM | POA: Insufficient documentation

## 2023-11-12 LAB — URINALYSIS, W/ REFLEX TO CULTURE (INFECTION SUSPECTED)
Bilirubin Urine: NEGATIVE
Glucose, UA: NEGATIVE mg/dL
Ketones, ur: NEGATIVE mg/dL
Leukocytes,Ua: NEGATIVE
Nitrite: NEGATIVE
Protein, ur: NEGATIVE mg/dL
Specific Gravity, Urine: 1.015 (ref 1.005–1.030)
pH: 7.5 (ref 5.0–8.0)

## 2023-11-12 MED ORDER — TIZANIDINE HCL 4 MG PO TABS: 4.0000 mg | ORAL_TABLET | Freq: Three times a day (TID) | ORAL | 0 refills | Status: AC | PRN

## 2023-11-12 MED ORDER — NAPROXEN 500 MG PO TABS: 500.0000 mg | ORAL_TABLET | Freq: Two times a day (BID) | ORAL | 0 refills | Status: AC

## 2023-11-12 MED ORDER — NITROFURANTOIN MONOHYD MACRO 100 MG PO CAPS: 100.0000 mg | ORAL_CAPSULE | Freq: Two times a day (BID) | ORAL | 0 refills | Status: AC

## 2023-11-12 MED ORDER — METHYLPREDNISOLONE 4 MG PO TBPK: ORAL_TABLET | Freq: Every day | ORAL | 0 refills | Status: AC

## 2023-11-12 MED ORDER — PHENAZOPYRIDINE HCL 200 MG PO TABS: 200.0000 mg | ORAL_TABLET | Freq: Three times a day (TID) | ORAL | 0 refills | Status: AC | PRN

## 2023-11-12 NOTE — ED Provider Notes (Signed)
 HPI  SUBJECTIVE:  Theresa Sanders is a 58 y.o. female who presents with 4 days of midline/right-sided low back pain described as constant, sharp that does not migrate or radiate.  She reports urinary frequency.  No recent trauma to her back, change in physical activity, heavy lifting, saddle anesthesia, urinary/fecal incontinence, urinary retention, numbness/tingling/weakness in her legs, fevers, abdominal or pelvic pain.  No dysuria, urgency, cloudy or odorous urine, hematuria.  No antibiotics in the past month.  She took ibuprofen 800 mg within the past 6 hours without improvement in her symptoms.  She has also been taking Flexeril and Neurontin.  Symptoms are worse with movement, bending forward, large positional changes and with sitting down for prolonged periods of time.  She has a past medical history of left-sided sciatica, hypertension, hypercholesterolemia, diverticulitis.  No history of aortic abdominal aneurysm, UTI, pyelonephritis, nephrolithiasis.  Family history negative for nephrolithiasis.  PCP: [facility].    Past Medical History:  Diagnosis Date   Anxiety    Depression    GERD (gastroesophageal reflux disease)    Hypertension     Past Surgical History:  Procedure Laterality Date   ABDOMINAL SURGERY     COLONOSCOPY WITH PROPOFOL N/A 09/28/2022   Procedure: COLONOSCOPY WITH PROPOFOL;  Surgeon: Regis Bill, MD;  Location: [facility];  Service: Endoscopy;  Laterality: N/A;   ESOPHAGOGASTRODUODENOSCOPY (EGD) WITH PROPOFOL N/A 09/28/2022   Procedure: ESOPHAGOGASTRODUODENOSCOPY (EGD) WITH PROPOFOL;  Surgeon: Regis Bill, MD;  Location: [facility];  Service: Endoscopy;  Laterality: N/A;   FOOT SURGERY Bilateral     Family History  Problem Relation Age of Onset   Atrial fibrillation Mother    Hypertension Mother    Atrial fibrillation Father    Atrial fibrillation Sister    Atrial fibrillation Brother     Social History   Tobacco Use    Smoking status: Every Day    Current packs/day: 0.50    Average packs/day: 0.5 packs/day for 25.0 years (12.5 ttl pk-yrs)    Types: Cigarettes   Smokeless tobacco: Never  Vaping Use   Vaping status: Never Used  Substance Use Topics   Alcohol use: Yes    Alcohol/week: 0.0 standard drinks of alcohol    Comment: occasionally   Drug use: No    No current facility-administered medications for this encounter.  Current Outpatient Medications:    amLODipine (NORVASC) 5 MG tablet, Take 1 tablet by mouth 2 (two) times daily., Disp: , Rfl:    hydrochlorothiazide (MICROZIDE) 12.5 MG capsule, Take 1 capsule (12.5 mg total) by mouth daily., Disp: 30 capsule, Rfl: 0   losartan (COZAAR) 50 MG tablet, Take 1 tablet (50 mg total) by mouth daily., Disp: 30 tablet, Rfl: 0   methylPREDNISolone (MEDROL DOSEPAK) 4 MG TBPK tablet, Take by mouth daily. Follow package instructions, Disp: 21 tablet, Rfl: 0   naproxen (NAPROSYN) 500 MG tablet, Take 1 tablet (500 mg total) by mouth 2 (two) times daily., Disp: 20 tablet, Rfl: 0   nitrofurantoin, macrocrystal-monohydrate, (MACROBID) 100 MG capsule, Take 1 capsule (100 mg total) by mouth 2 (two) times daily for 5 days., Disp: 10 capsule, Rfl: 0   omeprazole (PRILOSEC) 20 MG capsule, Take 20 mg by mouth daily., Disp: , Rfl:    PARoxetine (PAXIL) 20 MG tablet, TAKE ONE TABLET BY MOUTH ONCE DAILY, Disp: , Rfl:    phenazopyridine (PYRIDIUM) 200 MG tablet, Take 1 tablet (200 mg total) by mouth 3 (three) times daily as needed for pain.,  Disp: 6 tablet, Rfl: 0   tiZANidine (ZANAFLEX) 4 MG tablet, Take 1 tablet (4 mg total) by mouth every 8 (eight) hours as needed for muscle spasms., Disp: 30 tablet, Rfl: 0   albuterol (VENTOLIN HFA) 108 (90 Base) MCG/ACT inhaler, Inhale 2 puffs into the lungs every 4 (four) hours as needed., Disp: 6.7 g, Rfl: 0   clonazePAM (KLONOPIN) 1 MG tablet, Take by mouth., Disp: , Rfl:    gabapentin (NEURONTIN) 100 MG capsule, Take 100 mg by mouth at  bedtime., Disp: , Rfl:    losartan-hydrochlorothiazide (HYZAAR) 100-25 MG tablet, Take 1 tablet by mouth daily., Disp: , Rfl:    ondansetron (ZOFRAN-ODT) 4 MG disintegrating tablet, Take by mouth., Disp: , Rfl:    PARAGARD INTRAUTERINE COPPER IU, by Intrauterine route., Disp: , Rfl:    rosuvastatin (CRESTOR) 5 MG tablet, Take 5 mg by mouth daily., Disp: , Rfl:   No Known Allergies   ROS  As noted in HPI.   Physical Exam  BP 126/67 (BP Location: Left Arm)   Pulse (!) 59   Temp 98.5 F (36.9 C) (Oral)   Resp 17   SpO2 97%   Constitutional: Well developed, well nourished, no acute distress Eyes: PERRL, EOMI, conjunctiva normal bilaterally HENT: Normocephalic, atraumatic,mucus membranes moist Respiratory: Clear to auscultation bilaterally, no rales, no wheezing, no rhonchi Cardiovascular: Normal rate and rhythm, no murmurs, no gallops, no rubs GI: Soft, nondistended, normal bowel sounds, nontender, no rebound, no guarding Back: no CVAT Positive right paralumbar tenderness, positive muscle spasm. No L spine, sacral, SI joint bony tenderness.  Bilateral lower extremities nontender , baseline ROM with intact DP pulses, pain aggravated with right hip flexion/extension against resistance and left hip flexion against resistance..No pain with int/ext rotation,  abduction/adduction hips bilaterally. SLR neg bilaterally. Sensation intact to light touch bilaterally over both legs, DTR's symmetric and intact bilaterally KJ, Motor symmetric bilateral 5/5 hip flexion, quadriceps, hamstrings, EHL, foot dorsiflexion, foot plantarflexion, pain aggravated with large positional changes. skin: No rash, skin intact Musculoskeletal: no deformities Neurologic: Alert & oriented x 3, CN III-XII grossly intact, no motor deficits, sensation grossly intact Psychiatric: Speech and behavior appropriate   ED Course   Medications - No data to display  Orders Placed This Encounter  Procedures   Urine Culture     Standing Status:   Standing    Number of Occurrences:   1    Indication:   Dysuria   Urinalysis, w/ Reflex to Culture (Infection Suspected) -Urine, Clean Catch    Standing Status:   Standing    Number of Occurrences:   1    Specimen Source:   Urine, Clean Catch [[id number]]    Release to patient:   Immediate   Results for orders placed or performed during the hospital encounter of 11/12/23 (from the past 24 hours)  Urinalysis, w/ Reflex to Culture (Infection Suspected) -Urine, Clean Catch     Status: Abnormal   Collection Time: 11/12/23 11:08 AM  Result Value Ref Range   Specimen Source URINE, CLEAN CATCH    Color, Urine YELLOW YELLOW   APPearance CLEAR CLEAR   Specific Gravity, Urine 1.015 1.005 - 1.030   pH 7.5 5.0 - 8.0   Glucose, UA NEGATIVE NEGATIVE mg/dL   Hgb urine dipstick TRACE (A) NEGATIVE   Bilirubin Urine NEGATIVE NEGATIVE   Ketones, ur NEGATIVE NEGATIVE mg/dL   Protein, ur NEGATIVE NEGATIVE mg/dL   Nitrite NEGATIVE NEGATIVE   Leukocytes,Ua NEGATIVE NEGATIVE   Squamous  Epithelial / HPF 0-5 0 - 5 /HPF   WBC, UA 0-5 0 - 5 WBC/hpf   RBC / HPF 0-5 0 - 5 RBC/hpf   Bacteria, UA FEW (A) NONE SEEN   No results found.  ED Clinical Impression  1. Acute right-sided low back pain without sciatica   2. Urinary tract infection with hematuria, site unspecified      ED Assessment/Plan    1.  Back pain.  Appears to be very musculoskeletal.  She has no red flags on history or exam.  Deferring imaging today.  Doubt obstructing nephrolithiasis as she is tender in the paralumbar muscles, rather than around the kidney.  Home with Naprosyn/Tylenol, Zanaflex, Medrol Dosepak.   Heat, gentle stretching.  2.  Urinary frequency.  UA significant for trace hematuria and a few bacteria.  No leukocytes, esterase.  This was a clean-catch.  Will send this off for culture.  Home with Macrobid and Pyridium.  She is to discontinue the Macrobid if culture comes back negative for UTI.   Follow-up with  PCP as needed.  ER return precautions given.  Work note for 2 days  Discussed labs,  MDM, treatment plan, and plan for follow-up with patient Discussed sn/sx that should prompt return to the ED. patient agrees with plan.   Meds ordered this encounter  Medications   naproxen (NAPROSYN) 500 MG tablet    Sig: Take 1 tablet (500 mg total) by mouth 2 (two) times daily.    Dispense:  20 tablet    Refill:  0   tiZANidine (ZANAFLEX) 4 MG tablet    Sig: Take 1 tablet (4 mg total) by mouth every 8 (eight) hours as needed for muscle spasms.    Dispense:  30 tablet    Refill:  0   methylPREDNISolone (MEDROL DOSEPAK) 4 MG TBPK tablet    Sig: Take by mouth daily. Follow package instructions    Dispense:  21 tablet    Refill:  0   phenazopyridine (PYRIDIUM) 200 MG tablet    Sig: Take 1 tablet (200 mg total) by mouth 3 (three) times daily as needed for pain.    Dispense:  6 tablet    Refill:  0   nitrofurantoin, macrocrystal-monohydrate, (MACROBID) 100 MG capsule    Sig: Take 1 capsule (100 mg total) by mouth 2 (two) times daily for 5 days.    Dispense:  10 capsule    Refill:  0      *This clinic note was created using Scientist, clinical (histocompatibility and immunogenetics). Therefore, there may be occasional mistakes despite careful proofreading. ?    Domenick Gong, MD 11/15/23 1625

## 2023-11-12 NOTE — Discharge Instructions (Signed)
 500 mg of Naprosyn combined 1000 mg of Tylenol twice a day.  May take an additional 1000 mg of Tylenol once or twice a day.  Try the Zanaflex, discontinue Flexeril.  Medrol Dosepak for pain and swelling.  Heat has been shown to be beneficial.  Many people also find gentle stretching and deep tissue massage helpful.   Make sure you drink plenty of extra fluids.  Finish Macrobid, unless your urine culture comes back negative for UTI.  Then you can stop the Macrobid.  Pyridium will help with your symptoms.  It will turn your urine orange.  Go to www.goodrx.com  or www.costplusdrugs.com to look up your medications. This will give you a list of where you can find your prescriptions at the most affordable prices. Or ask the pharmacist what the cash price is, or if they have any other discount programs available to help make your medication more affordable. This can be less expensive than what you would pay with insurance.

## 2023-11-12 NOTE — ED Triage Notes (Signed)
 Sx started sat evening  Lower back pain- right side Urinary frequency

## 2023-11-13 LAB — URINE CULTURE

## 2023-11-14 ENCOUNTER — Telehealth (HOSPITAL_COMMUNITY): Payer: Self-pay

## 2023-11-14 NOTE — Telephone Encounter (Signed)
 TC to pt to advise of urine culture results and assess current symptoms.  Pt states back pain has somewhat improved, but that urinary symptoms have not. Pt advised to return to UC for recollection, especially if symptoms do not improve in the next 24 hrs. Pt verbalized understanding.

## 2023-12-03 ENCOUNTER — Ambulatory Visit
Admission: EM | Admit: 2023-12-03 | Discharge: 2023-12-03 | Attending: Emergency Medicine | Admitting: Emergency Medicine

## 2023-12-03 ENCOUNTER — Encounter: Payer: Self-pay | Admitting: Emergency Medicine

## 2023-12-03 ENCOUNTER — Ambulatory Visit (INDEPENDENT_AMBULATORY_CARE_PROVIDER_SITE_OTHER)

## 2023-12-03 DIAGNOSIS — R051 Acute cough: Secondary | ICD-10-CM | POA: Insufficient documentation

## 2023-12-03 DIAGNOSIS — M545 Low back pain, unspecified: Secondary | ICD-10-CM | POA: Insufficient documentation

## 2023-12-03 DIAGNOSIS — Z113 Encounter for screening for infections with a predominantly sexual mode of transmission: Secondary | ICD-10-CM | POA: Insufficient documentation

## 2023-12-03 DIAGNOSIS — Z87440 Personal history of urinary (tract) infections: Secondary | ICD-10-CM | POA: Diagnosis not present

## 2023-12-03 DIAGNOSIS — R509 Fever, unspecified: Secondary | ICD-10-CM | POA: Insufficient documentation

## 2023-12-03 DIAGNOSIS — I959 Hypotension, unspecified: Secondary | ICD-10-CM | POA: Insufficient documentation

## 2023-12-03 LAB — CBC WITH DIFFERENTIAL/PLATELET
Abs Immature Granulocytes: 0.02 10*3/uL (ref 0.00–0.07)
Basophils Absolute: 0 10*3/uL (ref 0.0–0.1)
Basophils Relative: 0 %
Eosinophils Absolute: 0 10*3/uL (ref 0.0–0.5)
Eosinophils Relative: 0 %
HCT: 34.5 % — ABNORMAL LOW (ref 36.0–46.0)
Hemoglobin: 11.7 g/dL — ABNORMAL LOW (ref 12.0–15.0)
Immature Granulocytes: 0 %
Lymphocytes Relative: 18 %
Lymphs Abs: 1 10*3/uL (ref 0.7–4.0)
MCH: 27.6 pg (ref 26.0–34.0)
MCHC: 33.9 g/dL (ref 30.0–36.0)
MCV: 81.4 fL (ref 80.0–100.0)
Monocytes Absolute: 0.4 10*3/uL (ref 0.1–1.0)
Monocytes Relative: 7 %
Neutro Abs: 4.1 10*3/uL (ref 1.7–7.7)
Neutrophils Relative %: 75 %
Platelets: 167 10*3/uL (ref 150–400)
RBC: 4.24 MIL/uL (ref 3.87–5.11)
RDW: 14.6 % (ref 11.5–15.5)
WBC: 5.7 10*3/uL (ref 4.0–10.5)
nRBC: 0 % (ref 0.0–0.2)

## 2023-12-03 LAB — SARS CORONAVIRUS 2 BY RT PCR: SARS Coronavirus 2 by RT PCR: NEGATIVE

## 2023-12-03 NOTE — ED Triage Notes (Signed)
 Patient states that she's still having lower back pain from her UTI that was treated.   Sx that started sat  Fever Dizziness Bodyaches cough

## 2023-12-03 NOTE — Discharge Instructions (Addendum)
 To the emergency department at [facility] to be evaluated for your fever and back pain.  I suspect that you may have a urinary tract infection or you may have a vaginal infection which is causing your symptoms.  Since you are unable to urinate for is here they may be able to catheterize you for a specimen at the ER or give you IV fluids to hydrate you so that you can produce a urine specimen.  They can also do a vaginal wet prep to look for the presence of BV or yeast which could be contributing to your symptoms.

## 2023-12-03 NOTE — ED Provider Notes (Signed)
 [facility]    CSN: [id number] Arrival date & time: 12/03/23  0801      History   Chief Complaint Chief Complaint  Patient presents with   Cough   Fever    HPI Theresa Sanders is a 58 y.o. female.   HPI  58 year old female with past medical history significant for anemia, anxiety, depression, hypertension, GERD, diverticulitis, and gout presents for evaluation of continued low back pain.  This has not improved since her previous visit on 11/12/2023.  She reports that 3 days ago she developed a fever with a Tmax of 101 and that she is continue to run fevers for the last 3 days.  She also endorses mild runny nose, nasal congestion, productive cough for green sputum, shortness breath, and wheezing.  She denies any sore throat, ear pain, abdominal pain, pain with urination, urinary urgency or frequency, hematuria, vaginal itching, or vaginal discharge.  She does report body aches and dizziness.  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/2020   Anxiety 01/03/2015   Clinical depression 01/03/2015   Diverticulitis 01/03/2015   Gout 01/03/2015   HPV test positive 01/03/2015   HLD (hyperlipidemia) 01/03/2015   Avitaminosis D 01/03/2015    Past Surgical History:  Procedure Laterality Date   ABDOMINAL SURGERY     COLONOSCOPY WITH PROPOFOL N/A 09/28/2022   Procedure: COLONOSCOPY WITH PROPOFOL;  Surgeon: Regis Bill, MD;  Location: [facility];  Service: Endoscopy;  Laterality: N/A;   ESOPHAGOGASTRODUODENOSCOPY (EGD) WITH PROPOFOL N/A 09/28/2022   Procedure: ESOPHAGOGASTRODUODENOSCOPY (EGD) WITH  PROPOFOL;  Surgeon: Regis Bill, MD;  Location: [facility];  Service: Endoscopy;  Laterality: N/A;   FOOT SURGERY Bilateral     OB History   No obstetric history on file.      Home Medications    Prior to Admission medications   Medication Sig Start Date End Date Taking? Authorizing Provider  amLODipine (NORVASC) 5 MG tablet Take 1 tablet by mouth 2 (two) times daily. 06/01/22 09/05/24 Yes [provider]  gabapentin (NEURONTIN) 100 MG capsule Take 100 mg by mouth at bedtime.   Yes [provider]  hydrochlorothiazide (MICROZIDE) 12.5 MG capsule Take 1 capsule (12.5 mg total) by mouth daily. 05/29/22  Yes Brimage, Vondra, DO  losartan (COZAAR) 50 MG tablet Take 1 tablet (50 mg total) by mouth daily. 05/29/22  Yes Brimage, Vondra, DO  losartan-hydrochlorothiazide (HYZAAR) 100-25 MG tablet Take 1 tablet by mouth daily.   Yes [provider]  PARoxetine (PAXIL) 20 MG tablet TAKE ONE TABLET BY MOUTH ONCE DAILY 12/31/14  Yes [provider]  rosuvastatin (CRESTOR) 5 MG tablet Take 5 mg by mouth daily.   Yes [provider]  albuterol (VENTOLIN HFA) 108 (90 Base) MCG/ACT inhaler Inhale 2 puffs into the lungs every 4 (four) hours as needed. 08/19/22   Brimage, Seward Meth, DO  clonazePAM (KLONOPIN) 1 MG tablet Take by mouth. 10/16/21   [provider]  methylPREDNISolone (MEDROL DOSEPAK) 4 MG TBPK tablet Take by mouth daily. Follow package instructions 11/12/23   Domenick Gong, MD  naproxen (NAPROSYN) 500  MG tablet Take 1 tablet (500 mg total) by mouth 2 (two) times daily. 11/12/23   Domenick Gong, MD  omeprazole (PRILOSEC) 20 MG capsule Take 20 mg by mouth daily.    [provider]  ondansetron (ZOFRAN-ODT) 4 MG disintegrating tablet Take by mouth. 10/03/21   [provider]  PARAGARD INTRAUTERINE COPPER IU by Intrauterine route.    [provider]  phenazopyridine (PYRIDIUM) 200 MG tablet Take 1 tablet (200  mg total) by mouth 3 (three) times daily as needed for pain. 11/12/23   Domenick Gong, MD  tiZANidine (ZANAFLEX) 4 MG tablet Take 1 tablet (4 mg total) by mouth every 8 (eight) hours as needed for muscle spasms. 11/12/23   Domenick Gong, MD  amitriptyline (ELAVIL) 10 MG tablet Take 10 mg by mouth at bedtime.  07/23/19  [provider]    Family History Family History  Problem Relation Age of Onset   Atrial fibrillation Mother    Hypertension Mother    Atrial fibrillation Father    Atrial fibrillation Sister    Atrial fibrillation Brother     Social History Social History   Tobacco Use   Smoking status: Every Day    Current packs/day: 0.50    Average packs/day: 0.5 packs/day for 25.0 years (12.5 ttl pk-yrs)    Types: Cigarettes   Smokeless tobacco: Never  Vaping Use   Vaping status: Never Used  Substance Use Topics   Alcohol use: Yes    Alcohol/week: 0.0 standard drinks of alcohol    Comment: occasionally   Drug use: No     Allergies   Patient has no known allergies.   Review of Systems Review of Systems  Constitutional:  Positive for fever.  HENT:  Positive for congestion and rhinorrhea. Negative for ear pain and sore throat.   Respiratory:  Positive for cough, shortness of breath and wheezing.   Gastrointestinal:  Negative for abdominal pain.  Genitourinary:  Negative for dysuria, frequency, hematuria, urgency, vaginal discharge and vaginal pain.  Musculoskeletal:  Positive for arthralgias, back pain and myalgias.  Skin:  Negative for rash.  Neurological:  Positive for dizziness.     Physical Exam Triage Vital Signs ED Triage Vitals  Encounter Vitals Group     BP      Systolic BP Percentile      Diastolic BP Percentile      Pulse      Resp      Temp      Temp src      SpO2      Weight      Height      Head Circumference      Peak Flow      Pain Score      Pain Loc      Pain Education      Exclude from Growth Chart    No data  found.  Updated Vital Signs BP 94/62 (BP Location: Right Arm)   Pulse 71   Temp 98.7 F (37.1 C) (Oral)   Resp 15   SpO2 95%   Visual Acuity Right Eye Distance:   Left Eye Distance:   Bilateral Distance:    Right Eye Near:   Left Eye Near:    Bilateral Near:     Physical Exam Vitals and nursing note reviewed.  Constitutional:      Appearance: She is ill-appearing.  HENT:     Head: Normocephalic and atraumatic.     Right Ear: Tympanic membrane,  ear canal and external ear normal. There is no impacted cerumen.     Left Ear: Tympanic membrane, ear canal and external ear normal. There is no impacted cerumen.     Nose: Nose normal. No congestion or rhinorrhea.     Mouth/Throat:     Mouth: Mucous membranes are moist.     Pharynx: Oropharynx is clear. No oropharyngeal exudate or posterior oropharyngeal erythema.  Cardiovascular:     Rate and Rhythm: Normal rate.     Pulses: Normal pulses.     Heart sounds: Normal heart sounds. No murmur heard.    No friction rub. No gallop.  Pulmonary:     Effort: Pulmonary effort is normal.     Breath sounds: Normal breath sounds. No wheezing, rhonchi or rales.  Musculoskeletal:     Cervical back: Normal range of motion and neck supple. No tenderness.  Lymphadenopathy:     Cervical: No cervical adenopathy.  Skin:    General: Skin is warm and dry.     Capillary Refill: Capillary refill takes less than 2 seconds.     Findings: No rash.  Neurological:     General: No focal deficit present.     Mental Status: She is alert and oriented to person, place, and time.      UC Treatments / Results  Labs (all labs ordered are listed, but only abnormal results are displayed) Labs Reviewed  CBC WITH DIFFERENTIAL/PLATELET - Abnormal; Notable for the following components:      Result Value   Hemoglobin 11.7 (*)    HCT 34.5 (*)    All other components within normal limits  SARS CORONAVIRUS 2 BY RT PCR  URINALYSIS, W/ REFLEX TO CULTURE  (INFECTION SUSPECTED)  CERVICOVAGINAL ANCILLARY ONLY    EKG   Radiology No results found.  Procedures Procedures (including critical care time)  Medications Ordered in UC Medications - No data to display  Initial Impression / Assessment and Plan / UC Course  I have reviewed the triage vital signs and the nursing notes.  Pertinent labs & imaging results that were available during my care of the patient were reviewed by me and considered in my medical decision making (see chart for details).   Patient is a pleasant, though mildly ill-appearing, 58 year old female presenting for evaluation of flulike symptoms as outlined HPI above.  She is reporting a fever with a Tmax of 101, runny nose, nasal congestion, dizziness, body aches, cough productive for green sputum, shortness of breath, wheezing, and dizziness.  She was seen on 11/12/2023 and treated for low back pain and possible UTI.  Culture grew out mixed genital flora and repeat collection was suggested.  I will order a repeat urinalysis to ensure resolution of UTI.  She denies any vaginal discharge or itching, though with her recent antibiotic prescription I will order a vaginal wet prep to evaluate for the presence of BV or yeast which could be contributing to her ongoing back pain.  Additionally, I will order a COVID PCR.  I will not test him for flu at this time as she has had symptoms for the last 3 days.  I will however order a chest x-ray to evaluate for any acute cardiopulmonary pathology.  Chest x-ray independently reviewed and evaluated by me.  Impression: Lung fields are well aerated without evidence of infiltrate or effusion.  Cardiomediastinal silhouette appears normal.  Radiology overread is pending.  COVID PCR is negative.  CBC shows a normal white count of 5.7.  Red  count is normal at 4.12.  Mild anemia with an H&H of 11.7 and 34.5 respectively.  MCV, MCH, MCHC are all normal.  RDW is normal.  Platelets are normal at  167.  Patient reports that she is unable to urinate, despite drinking 32 ounces of water.  She reports that she feels dehydrated.  Her lips are dry.  Given that the rest of her workup is unremarkable I am concerned that her fever is stemming from either a vaginal infection or from a urinary tract infection.  I have therefore suggested that the patient go to the emergency department where they might feel to give her IV fluids to rehydrate her and obtain a urine specimen as well as do a vaginal wet prep.  She has elected to go to [facility].   Final Clinical Impressions(s) / UC Diagnoses   Final diagnoses:  Acute cough  Low back pain, unspecified back pain laterality, unspecified chronicity, unspecified whether sciatica present  Fever, unspecified fever cause  Hypotension, unspecified hypotension type     Discharge Instructions      To the emergency department at [facility] to be evaluated for your fever and back pain.  I suspect that you may have a urinary tract infection or you may have a vaginal infection which is causing your symptoms.  Since you are unable to urinate for is here they may be able to catheterize you for a specimen at the ER or give you IV fluids to hydrate you so that you can produce a urine specimen.  They can also do a vaginal wet prep to look for the presence of BV or yeast which could be contributing to your symptoms.     ED Prescriptions   None    PDMP not reviewed this encounter.   Becky Augusta, NP 12/03/23 [phone]

## 2023-12-03 NOTE — ED Notes (Signed)
 Patient is being discharged from the Urgent Care and sent to the Emergency Department via personal vehicle . Per Berneta Sages NP, patient is in need of higher level of care due to fever and back pain. Patient is aware and verbalizes understanding of plan of care.  Vitals:   12/03/23 0820 12/03/23 0847  BP: (!) 90/56 94/62  Pulse: 71   Resp: 15   Temp: 98.7 F (37.1 C)   SpO2: 95%

## 2023-12-04 ENCOUNTER — Telehealth (HOSPITAL_COMMUNITY): Payer: Self-pay

## 2023-12-04 LAB — CERVICOVAGINAL ANCILLARY ONLY
Bacterial Vaginitis (gardnerella): POSITIVE — AB
Candida Glabrata: NEGATIVE
Candida Vaginitis: NEGATIVE
Chlamydia: NEGATIVE
Comment: NEGATIVE
Comment: NEGATIVE
Comment: NEGATIVE
Comment: NEGATIVE
Comment: NEGATIVE
Comment: NORMAL
Neisseria Gonorrhea: NEGATIVE
Trichomonas: NEGATIVE

## 2023-12-04 MED ORDER — METRONIDAZOLE 500 MG PO TABS: 500.0000 mg | ORAL_TABLET | Freq: Two times a day (BID) | ORAL | 0 refills | Status: AC

## 2023-12-04 NOTE — Telephone Encounter (Signed)
 Per protocol, pt requires tx with metronidazole. Rx sent to pharmacy on file.

## 2024-08-24 NOTE — ED Triage Notes (Signed)
 Pt ambulatory to triage with C/O left upper chest and shoulder pain that started around 0500

## 2024-09-15 ENCOUNTER — Ambulatory Visit: Admitting: Family Medicine
